# Patient Record
Sex: Male | Born: 1970 | Race: Black or African American | Hispanic: No | Marital: Single | Smoking: Never smoker
Health system: Southern US, Community
[De-identification: ages and names within clinical notes are randomized; demographics above are authoritative.]

## PROBLEM LIST (undated history)

## (undated) DIAGNOSIS — E119 Type 2 diabetes mellitus without complications: Secondary | ICD-10-CM

## (undated) DIAGNOSIS — I10 Essential (primary) hypertension: Secondary | ICD-10-CM

## (undated) DIAGNOSIS — J45909 Unspecified asthma, uncomplicated: Secondary | ICD-10-CM

## (undated) DIAGNOSIS — E669 Obesity, unspecified: Secondary | ICD-10-CM

## (undated) DIAGNOSIS — J9622 Acute and chronic respiratory failure with hypercapnia: Secondary | ICD-10-CM

## (undated) DIAGNOSIS — E876 Hypokalemia: Secondary | ICD-10-CM

---

## 2016-06-17 HISTORY — PX: TRACHEOSTOMY: SUR1362

## 2017-05-28 ENCOUNTER — Emergency Department
Admission: EM | Admit: 2017-05-28 | Discharge: 2017-05-28 | Disposition: A | Discharge status: Home / Self Care | Payer: Medicare Other | Attending: Emergency Medicine | Admitting: Emergency Medicine

## 2017-05-28 ENCOUNTER — Encounter: Payer: Self-pay | Admitting: Emergency Medicine

## 2017-05-28 ENCOUNTER — Other Ambulatory Visit: Payer: Self-pay

## 2017-05-28 DIAGNOSIS — J9500 Unspecified tracheostomy complication: Secondary | ICD-10-CM | POA: Diagnosis not present

## 2017-05-28 DIAGNOSIS — J45909 Unspecified asthma, uncomplicated: Secondary | ICD-10-CM | POA: Diagnosis not present

## 2017-05-28 DIAGNOSIS — I10 Essential (primary) hypertension: Secondary | ICD-10-CM | POA: Diagnosis not present

## 2017-05-28 HISTORY — DX: Acute and chronic respiratory failure with hypercapnia: J96.22

## 2017-05-28 HISTORY — DX: Hypokalemia: E87.6

## 2017-05-28 HISTORY — DX: Essential (primary) hypertension: I10

## 2017-05-28 HISTORY — DX: Obesity, unspecified: E66.9

## 2017-05-28 HISTORY — DX: Unspecified asthma, uncomplicated: J45.909

## 2017-05-28 NOTE — Progress Notes (Signed)
Placed pt on 35% ATC for pt desats. RN and MD aware.

## 2017-05-28 NOTE — ED Notes (Signed)
Report given to [facility]. Caregiver verbalized understanding and denied questions.

## 2017-05-28 NOTE — ED Provider Notes (Signed)
[facility] Emergency Department Provider Note    First MD Initiated Contact with Patient 05/28/17 [phone]     (approximate)  I have reviewed the triage vital signs and the nursing notes.   HISTORY  Chief Complaint Tracheostomy Tube Change    HPI Edgar Taylor is a 46 y.o. male below list of chronic medical conditions presents to the emergency department via EMS [facility] with history of tracheostomy tube dislodgment. Patient unsure when [name] was dislodged.  Patient states that trach was placed at [facility] in October.   Past Medical History:  Diagnosis Date  . Acute and chronic respiratory failure with hypercapnia (HCC)   . Asthma   . Hypertension   . Hypokalemia   . Obesity     There are no active problems to display for this patient.     Prior to Admission medications   Not on File    Allergies No known drug allergies No family history on file.  Social History Social History   Tobacco Use  . Smoking status: Not on file  Substance Use Topics  . Alcohol use: Not on file  . Drug use: Not on file    Review of Systems Constitutional: No fever/chills Eyes: No visual changes. ENT: No sore throat.  Positive for dislodged tracheostomy tube  Cardiovascular: Denies chest pain. Respiratory: Denies shortness of breath. Gastrointestinal: No abdominal pain.  No nausea, no vomiting.  No diarrhea.  No constipation. Genitourinary: Negative for dysuria. Musculoskeletal: Negative for neck pain.  Negative for back pain. Integumentary: Negative for rash. Neurological: Negative for headaches, focal weakness or numbness.   ____________________________________________   PHYSICAL EXAM:  VITAL SIGNS: ED Triage Vitals  Enc Vitals Group     BP 05/28/17 0523 136/64     Pulse Rate 05/28/17 0523 (!) 105     Resp --      Temp 05/28/17 0523 98.1 F (36.7 C)     Temp Source 05/28/17 0523 Oral     SpO2 05/28/17 0523 94 %   Weight 05/28/17 0524 (!) 225.9 kg (498 lb)     Height 05/28/17 0524 1.778 m (5\' 10" )     Head Circumference --      Peak Flow --      Pain Score 05/28/17 0523 0     Pain Loc --      Pain Edu? --      Excl. in GC? --     Constitutional: Alert and oriented. Well appearing and in no acute distress. Eyes: Conjunctivae are normal. Head: Atraumatic. Mouth/Throat: Mucous membranes are moist.  Tracheostomy site without any signs of or drainage. Neck: No stridor.   Cardiovascular: Normal rate, regular rhythm. Good peripheral circulation. Grossly normal heart sounds. Respiratory: Normal respiratory effort.  No retractions. Lungs CTAB. Gastrointestinal: Soft and nontender. No distention.   Musculoskeletal: No lower extremity tenderness nor edema. No gross deformities of extremities. Neurologic:  Normal speech and language. No gross focal neurologic deficits are appreciated.  Skin:  Skin is warm, dry and intact. No rash noted. Psychiatric: Mood and affect are normal. Speech and behavior are normal.    Procedures   ____________________________________________   INITIAL IMPRESSION / ASSESSMENT AND PLAN / ED COURSE  As part of my medical decision making, I reviewed the following data within the electronic MEDICAL RECORD [id number] year old male presenting with dislodged tracheostomy tube.  Per [facility] patient's tracheostomy tube was not 8 cuffed.  Unable to place an 8 cuffed secondary  to small diameter of the tracheostomy opening and as such a 4 was placed.  Patient's oxygen saturation 98% on trach collar.  Patient denies any discomfort or dyspnea.  Patient be referred to ENT Dr. Willeen CassBennett for further outpatient evaluation. ____________________________________________  FINAL CLINICAL IMPRESSION(S) / ED DIAGNOSES  Final diagnoses:  Complication of tracheostomy tube (HCC)     MEDICATIONS GIVEN DURING THIS VISIT:  Medications - No data to display   ED Discharge Orders    None        Note:  This document was prepared using Dragon voice recognition software and may include unintentional dictation errors.    Darci CurrentBrown, Zavalla N, MD 05/29/17 [phone]

## 2017-05-28 NOTE — Progress Notes (Signed)
Called to replace missing trach by RN. Pt stated and facility confirmed pt used #8 Shiley trach. Attempted to place #8 cuffless trach without success. Using a bougie catheter I was able to replace trach with a #4 cuffless Shiley trach. Small amount of bloody secretions around trach site. Edgar Taylor was secured with trach collar tie, placement was confirmed by EtCo2 with positive color change.

## 2017-05-28 NOTE — ED Notes (Signed)
SpO2 of 88% documented at 0800 incorrect.

## 2017-05-28 NOTE — ED Triage Notes (Addendum)
Patient coming from [facility] for missing trach from midnight to 3am. Patient had vomited earlier and was sent here when they realized his trach was missing.

## 2017-05-28 NOTE — ED Notes (Signed)
Kristie from RT placed a cuffless # 4 when patient was missing trach from [facility]. Attempted to place #8 cuffless but did not fit.

## 2017-05-29 ENCOUNTER — Emergency Department: Payer: Medicare Other

## 2017-05-29 ENCOUNTER — Observation Stay
Admission: EM | Admit: 2017-05-29 | Discharge: 2017-05-30 | Disposition: A | Discharge status: Skilled Nursing Facility | Payer: Medicare Other | Attending: Internal Medicine | Admitting: Internal Medicine

## 2017-05-29 ENCOUNTER — Encounter: Payer: Self-pay | Admitting: Emergency Medicine

## 2017-05-29 DIAGNOSIS — R0902 Hypoxemia: Secondary | ICD-10-CM | POA: Diagnosis present

## 2017-05-29 DIAGNOSIS — Z79899 Other long term (current) drug therapy: Secondary | ICD-10-CM | POA: Diagnosis not present

## 2017-05-29 DIAGNOSIS — I10 Essential (primary) hypertension: Secondary | ICD-10-CM | POA: Insufficient documentation

## 2017-05-29 DIAGNOSIS — J9621 Acute and chronic respiratory failure with hypoxia: Secondary | ICD-10-CM | POA: Diagnosis not present

## 2017-05-29 DIAGNOSIS — E876 Hypokalemia: Secondary | ICD-10-CM | POA: Insufficient documentation

## 2017-05-29 DIAGNOSIS — Y833 Surgical operation with formation of external stoma as the cause of abnormal reaction of the patient, or of later complication, without mention of misadventure at the time of the procedure: Secondary | ICD-10-CM | POA: Diagnosis not present

## 2017-05-29 DIAGNOSIS — J9503 Malfunction of tracheostomy stoma: Principal | ICD-10-CM | POA: Insufficient documentation

## 2017-05-29 DIAGNOSIS — D72829 Elevated white blood cell count, unspecified: Secondary | ICD-10-CM | POA: Insufficient documentation

## 2017-05-29 DIAGNOSIS — G4733 Obstructive sleep apnea (adult) (pediatric): Secondary | ICD-10-CM | POA: Insufficient documentation

## 2017-05-29 DIAGNOSIS — J9500 Unspecified tracheostomy complication: Secondary | ICD-10-CM | POA: Diagnosis present

## 2017-05-29 DIAGNOSIS — Z6841 Body Mass Index (BMI) 40.0 and over, adult: Secondary | ICD-10-CM | POA: Diagnosis not present

## 2017-05-29 DIAGNOSIS — J45909 Unspecified asthma, uncomplicated: Secondary | ICD-10-CM | POA: Diagnosis not present

## 2017-05-29 LAB — BASIC METABOLIC PANEL
Anion gap: 9 (ref 5–15)
BUN: 13 mg/dL (ref 6–20)
CALCIUM: 9.2 mg/dL (ref 8.9–10.3)
CO2: 37 mmol/L — AB (ref 22–32)
CREATININE: 1.13 mg/dL (ref 0.61–1.24)
Chloride: 90 mmol/L — ABNORMAL LOW (ref 101–111)
Glucose, Bld: 106 mg/dL — ABNORMAL HIGH (ref 65–99)
Potassium: 3.4 mmol/L — ABNORMAL LOW (ref 3.5–5.1)
Sodium: 136 mmol/L (ref 135–145)

## 2017-05-29 LAB — CBC WITH DIFFERENTIAL/PLATELET
BASOS PCT: 1 %
Basophils Absolute: 0.1 10*3/uL (ref 0–0.1)
EOS ABS: 0.2 10*3/uL (ref 0–0.7)
EOS PCT: 2 %
HEMATOCRIT: 33.6 % — AB (ref 40.0–52.0)
Hemoglobin: 11.1 g/dL — ABNORMAL LOW (ref 13.0–18.0)
Lymphocytes Relative: 13 %
Lymphs Abs: 1.7 10*3/uL (ref 1.0–3.6)
MCH: 27.9 pg (ref 26.0–34.0)
MCHC: 33 g/dL (ref 32.0–36.0)
MCV: 84.6 fL (ref 80.0–100.0)
MONO ABS: 1.4 10*3/uL — AB (ref 0.2–1.0)
MONOS PCT: 11 %
Neutro Abs: 9.5 10*3/uL — ABNORMAL HIGH (ref 1.4–6.5)
Neutrophils Relative %: 73 %
PLATELETS: 344 10*3/uL (ref 150–440)
RBC: 3.97 MIL/uL — ABNORMAL LOW (ref 4.40–5.90)
RDW: 14.2 % (ref 11.5–14.5)
WBC: 13 10*3/uL — ABNORMAL HIGH (ref 3.8–10.6)

## 2017-05-29 LAB — URINALYSIS, COMPLETE (UACMP) WITH MICROSCOPIC
BACTERIA UA: NONE SEEN
BILIRUBIN URINE: NEGATIVE
Glucose, UA: NEGATIVE mg/dL
Hgb urine dipstick: NEGATIVE
Ketones, ur: NEGATIVE mg/dL
Leukocytes, UA: NEGATIVE
NITRITE: NEGATIVE
PH: 6 (ref 5.0–8.0)
Protein, ur: NEGATIVE mg/dL
SPECIFIC GRAVITY, URINE: 1.006 (ref 1.005–1.030)
SQUAMOUS EPITHELIAL / LPF: NONE SEEN

## 2017-05-29 MED ORDER — POLYETHYLENE GLYCOL 3350 17 G PO PACK
17.0000 g | PACK | Freq: Every day | ORAL | Status: DC
  Administered 2017-05-30 (×2): 17 g via ORAL
  Filled 2017-05-29 (×2): qty 1

## 2017-05-29 MED ORDER — METFORMIN HCL 500 MG PO TABS
500.0000 mg | ORAL_TABLET | Freq: Two times a day (BID) | ORAL | Status: DC
  Administered 2017-05-30: 500 mg via ORAL
  Filled 2017-05-29 (×3): qty 1

## 2017-05-29 MED ORDER — FAMOTIDINE 20 MG PO TABS
20.0000 mg | ORAL_TABLET | Freq: Every day | ORAL | Status: DC
  Administered 2017-05-30: 20 mg via ORAL
  Filled 2017-05-29: qty 1

## 2017-05-29 MED ORDER — TORSEMIDE 20 MG PO TABS
40.0000 mg | ORAL_TABLET | Freq: Every day | ORAL | Status: DC
  Administered 2017-05-30 (×2): 40 mg via ORAL
  Filled 2017-05-29 (×2): qty 2

## 2017-05-29 MED ORDER — AMLODIPINE BESYLATE 5 MG PO TABS
5.0000 mg | ORAL_TABLET | Freq: Every day | ORAL | Status: DC
  Administered 2017-05-30: 08:00:00 5 mg via ORAL
  Filled 2017-05-29: qty 1

## 2017-05-29 MED ORDER — ACETAMINOPHEN 325 MG PO TABS: 650.0000 mg | ORAL_TABLET | Freq: Four times a day (QID) | ORAL | Status: DC | PRN

## 2017-05-29 MED ORDER — ONDANSETRON HCL 4 MG PO TABS: 4.0000 mg | ORAL_TABLET | Freq: Four times a day (QID) | ORAL | Status: DC | PRN

## 2017-05-29 MED ORDER — ACETAMINOPHEN 650 MG RE SUPP: 650.0000 mg | Freq: Four times a day (QID) | RECTAL | Status: DC | PRN

## 2017-05-29 MED ORDER — SODIUM CHLORIDE 0.9% FLUSH: 3.0000 mL | INTRAVENOUS | Status: DC | PRN

## 2017-05-29 MED ORDER — HYDROCODONE-ACETAMINOPHEN 5-325 MG PO TABS: 1.0000 | ORAL_TABLET | ORAL | Status: DC | PRN

## 2017-05-29 MED ORDER — ALBUTEROL SULFATE (2.5 MG/3ML) 0.083% IN NEBU: 2.5000 mg | INHALATION_SOLUTION | RESPIRATORY_TRACT | Status: DC | PRN

## 2017-05-29 MED ORDER — ENOXAPARIN SODIUM 40 MG/0.4ML ~~LOC~~ SOLN
40.0000 mg | Freq: Two times a day (BID) | SUBCUTANEOUS | Status: DC
  Administered 2017-05-30: 40 mg via SUBCUTANEOUS
  Filled 2017-05-29: qty 0.4

## 2017-05-29 MED ORDER — ONDANSETRON HCL 4 MG/2ML IJ SOLN: 4.0000 mg | Freq: Four times a day (QID) | INTRAMUSCULAR | Status: DC | PRN

## 2017-05-29 MED ORDER — SODIUM CHLORIDE 0.9% FLUSH
3.0000 mL | Freq: Two times a day (BID) | INTRAVENOUS | Status: DC
  Administered 2017-05-30: 3 mL via INTRAVENOUS

## 2017-05-29 MED ORDER — POTASSIUM CHLORIDE 20 MEQ/15ML (10%) PO SOLN
40.0000 meq | Freq: Once | ORAL | Status: AC
  Administered 2017-05-30: 03:00:00 40 meq via ORAL
  Filled 2017-05-29: qty 30

## 2017-05-29 MED ORDER — SODIUM CHLORIDE 0.9 % IV SOLN: 250.0000 mL | INTRAVENOUS | Status: DC | PRN

## 2017-05-29 NOTE — ED Notes (Signed)
Admitting MD at bedside at this time.

## 2017-05-29 NOTE — Progress Notes (Signed)
Anticoagulation monitoring(Lovenox):  46 yo male ordered Lovenox 40 mg Q24h  Filed Weights   05/29/17 1304  Weight: (!) 498 lb (225.9 kg)   BMI 71.46   Lab Results  Component Value Date   CREATININE 1.13 05/29/2017   Estimated Creatinine Clearance: 158.3 mL/min (by C-G formula based on SCr of 1.13 mg/dL). Hemoglobin & Hematocrit     Component Value Date/Time   HGB 11.1 (L) 05/29/2017 1450   HCT 33.6 (L) 05/29/2017 1450     Per Protocol for Patient with estCrcl > 30 ml/min and BMI > 40, will transition to Lovenox 40 mg Q12h.

## 2017-05-29 NOTE — ED Provider Notes (Signed)
[facility] Emergency Department Provider Note  ____________________________________________   First MD Initiated Contact with Patient 05/29/17 1315     (approximate)  I have reviewed the triage vital signs and the nursing notes.   HISTORY  Chief Complaint Tracheostomy Tube Change   HPI Edgar Taylor is a 46 y.o. male with a history of obesity as well as acute on chronic respiratory failure with hypercapnia with a tracheostomy who is presenting with tracheostomy malfunction.  The patient was just seen here yesterday for displaced tracheostomy.  He is now returning for the tracheostomy being displaced again.  He says that he has had this in for 3-4 months.  Yesterday when in the emergency department the patient had a size 4 tracheostomy placed, downgraded from a size 8 that was in place previously.  Patient does not report any respiratory distress at this time.  Is questioning whether he can have the tracheostomy removed.   Past Medical History:  Diagnosis Date  . Acute and chronic respiratory failure with hypercapnia (HCC)   . Asthma   . Hypertension   . Hypokalemia   . Obesity     There are no active problems to display for this patient.   History reviewed. No pertinent surgical history.  Prior to Admission medications   Medication Sig Start Date End Date Taking? Authorizing Provider  acetaminophen (TYLENOL) 325 MG tablet Take 650 mg by mouth every 4 (four) hours as needed for mild pain or fever.   Yes [provider]  amLODipine (NORVASC) 5 MG tablet Take 5 mg by mouth daily.   Yes [provider]  enoxaparin (LOVENOX) 40 MG/0.4ML injection Inject 40 mg into the skin daily. 05/20/17  Yes [provider]  famotidine (PEPCID) 20 MG tablet Take 20 mg by mouth at bedtime.   Yes [provider]  levalbuterol (XOPENEX) 1.25 MG/3ML nebulizer solution Take 1.25 mg by nebulization every 6 (six) hours as needed for  wheezing.   Yes [provider]  metFORMIN (GLUCOPHAGE) 500 MG tablet Take 500 mg by mouth 2 (two) times daily with a meal.   Yes [provider]  Multiple Vitamins-Minerals (MULTIVITAMIN WITH MINERALS) tablet Take 1 tablet by mouth daily.   Yes [provider]  ondansetron (ZOFRAN) 4 MG/2ML SOLN injection Inject 4 mg into the vein every 6 (six) hours as needed for nausea or vomiting.   Yes [provider]  torsemide (DEMADEX) 20 MG tablet Take 40 mg by mouth daily.   Yes [provider]  polyethylene glycol (MIRALAX / GLYCOLAX) packet Take 17 g by mouth daily.    [provider]    Allergies Patient has no known allergies.  No family history on file.  Social History Social History   Tobacco Use  . Smoking status: Never Smoker  . Smokeless tobacco: Never Used  Substance Use Topics  . Alcohol use: No    Frequency: Never  . Drug use: No    Review of Systems  Constitutional: No fever/chills Eyes: No visual changes. ENT: No sore throat. Cardiovascular: Denies chest pain. Respiratory: Denies shortness of breath. Gastrointestinal: No abdominal pain.  No nausea, no vomiting.  No diarrhea.  No constipation. Genitourinary: Negative for dysuria. Musculoskeletal: Negative for back pain. Skin: Negative for rash. Neurological: Negative for headaches, focal weakness or numbness.   ____________________________________________   PHYSICAL EXAM:  VITAL SIGNS: ED Triage Vitals [05/29/17 1304]  Enc Vitals Group     BP (!) 151/67  Pulse Rate (!) 110     Resp (!) 22     Temp 98.3 F (36.8 C)     Temp Source Oral     SpO2 94 %     Weight (!) 498 lb (225.9 kg)     Height      Head Circumference      Peak Flow      Pain Score 0     Pain Loc      Pain Edu?      Excl. in GC?     Constitutional: Alert and oriented.  in no acute distress. Eyes: Conjunctivae are normal.  Head: Atraumatic. Nose: No  congestion/rhinnorhea. Mouth/Throat: Mucous membranes are moist.  Neck: No stridor.  Stoma visualized and without any surrounding erythema, induration or pus. Cardiovascular: Mild tachycardia to the low 100s, regular rhythm. Grossly normal heart sounds.  Respiratory: Normal respiratory effort.  No retractions. Lungs CTAB. Gastrointestinal: Soft and nontender. No distention. Musculoskeletal: No lower extremity tenderness.  No joint effusions.  Minimal lower extremity edema bilaterally. Neurologic:  Normal speech and language. No gross focal neurologic deficits are appreciated. Skin:  Skin is warm, dry and intact. No rash noted. Psychiatric: Mood and affect are normal. Speech and behavior are normal.  ____________________________________________   LABS (all labs ordered are listed, but only abnormal results are displayed)  Labs Reviewed  CBC WITH DIFFERENTIAL/PLATELET - Abnormal; Notable for the following components:      Result Value   WBC 13.0 (*)    RBC 3.97 (*)    Hemoglobin 11.1 (*)    HCT 33.6 (*)    Neutro Abs 9.5 (*)    Monocytes Absolute 1.4 (*)    All other components within normal limits  BASIC METABOLIC PANEL - Abnormal; Notable for the following components:   Potassium 3.4 (*)    Chloride 90 (*)    CO2 37 (*)    Glucose, Bld 106 (*)    All other components within normal limits  URINALYSIS, COMPLETE (UACMP) WITH MICROSCOPIC   ____________________________________________  EKG   ____________________________________________  RADIOLOGY   ____________________________________________   PROCEDURES  Procedure(s) performed:  I attempted to replace the size 4 tracheostomy with lubrication.  However, I was unable to pass it.  There appeared to be obstruction.  Respiratory was called and they were able to pass a placeholder tube and then we attempted to put in a suction catheter and put a size for tracheostomy over that but despite being able to pass the suction tube  were unable to pass the tracheostomy.  Procedures  Critical Care performed:   ____________________________________________   INITIAL IMPRESSION / ASSESSMENT AND PLAN / ED COURSE  Pertinent labs & imaging results that were available during my care of the patient were reviewed by me and considered in my medical decision making (see chart for details).  DDX: Stoma closure, tracheostomy malfunction As part of my medical decision making, I reviewed the following data within the electronic MEDICAL RECORD NUMBER Notes from prior ED visits  ----------------------------------------- 3:18 PM on 05/29/2017 -----------------------------------------  I discussed the case with Dr. Andee PolesVaught of ear nose and throat who says that since the patient is able to function with what appears to be a closed tracheostomy to put a gauze pad over the tracheostomy and a piece of tape to see how the patient does.  It may be that the patient will be able to not need his tracheostomy.  However, Dr. Andee PolesVaught says that he will be in  the emergency department to evaluate within several hours.  Patient doing well at this time on nasal cannula oxygen.    ----------------------------------------- 5:38 PM on 05/29/2017 -----------------------------------------  Evaluated at the bedside by Dr. Andee PolesVaught who discussed the case with the patient and will be leaving out the tracheostomy.  Without the tracheostomy the patient desaturated to 87% on room air.  He was placed on supplemental nasal cannula oxygen.  I checked with his long-term care facility, [facility], says they are unable to monitor his oxygen level constantly at [facility].  Patient does not have established nasal cannula oxygen at home.  Patient will be admitted to the hospital.  I feel that he likely also has undiagnosed sleep apnea and would do poorly if sent back to [facility] unmonitored.  Admitted to Dr. Imogene Burnhen.  Patient aware of the plan and  diagnosis and willing to comply.  Chest x-ray placed because of hypoxia.  Urinalysis placed at the request of Dr. Imogene Burnhen because of elevated white blood cell count.     ____________________________________________   FINAL CLINICAL IMPRESSION(S) / ED DIAGNOSES  Tracheostomy malfunction. Hypoxia   NEW MEDICATIONS STARTED DURING THIS VISIT:  This SmartLink is deprecated. Use AVSMEDLIST instead to display the medication list for a patient.   Note:  This document was prepared using Dragon voice recognition software and may include unintentional dictation errors.     Myrna BlazerSchaevitz, David Matthew, MD 05/29/17 1739

## 2017-05-29 NOTE — ED Notes (Signed)
MD at bedside, RT called to help reinsert trach

## 2017-05-29 NOTE — ED Triage Notes (Addendum)
Pt to ED via EMS from [facility] , pt has pulled out trach, states " it keeps falling out" . PT was seen last night and MD Manson PasseyBrown reinserted tube. Pt denies any SOB . Per staff at facility, pt has been stable without trach and they have been trying to contact ENT for d/c of trach. VS stable

## 2017-05-29 NOTE — ED Notes (Signed)
RT at bedside.

## 2017-05-29 NOTE — H&P (Signed)
[facility] [place] [facility]   PATIENT NAME: Edgar PanningKenneth Mcmullen    MR#:  [id number]  DATE OF BIRTH:  01/11/1971  DATE OF ADMISSION:  05/29/2017  PRIMARY CARE PHYSICIAN: System, Pcp Not In   REQUESTING/REFERRING PHYSICIAN: Myrna BlazerSchaevitz, David Matthew, MD  CHIEF COMPLAINT:   Chief Complaint  Patient presents with  . Tracheostomy Tube Change   Tracheostomy dislodge HISTORY OF PRESENT ILLNESS:  Edgar Taylor  is a 46 y.o. male with a known history of acute on chronic respiratory failure, asthma, hypertension, obesity and OSA.  The patient was sent from nursing home to ED due to above chief complaints.  The patient was here yesterday for displaced a tracheostomy.  He was put on a size 4 tracheostomy and sent back to nursing facility.  He is now returning from tracheostomy being displaced again.  He was found hypoxia with O2 saturation 87% in room air. ENT physician Dr. Andee PolesVaught suggested leaving out the tracheostomy tube and monitor O2 saturation.  Since the long-term care facility can now to monitor his oxygen level constantly, ED physician request admission.  PAST MEDICAL HISTORY:   Past Medical History:  Diagnosis Date  . Acute and chronic respiratory failure with hypercapnia (HCC)   . Asthma   . Hypertension   . Hypokalemia   . Obesity     PAST SURGICAL HISTORY:  History reviewed. No pertinent surgical history.  SOCIAL HISTORY:   Social History   Tobacco Use  . Smoking status: Never Smoker  . Smokeless tobacco: Never Used  Substance Use Topics  . Alcohol use: No    Frequency: Never    FAMILY HISTORY:  No family history on file.  DRUG ALLERGIES:  No Known Allergies  REVIEW OF SYSTEMS:   Review of Systems  Constitutional: Negative for chills, fever and malaise/fatigue.  HENT: Negative for sore throat.   Eyes: Negative for blurred vision and double vision.  Respiratory: Positive for shortness of breath. Negative for cough, hemoptysis,  wheezing and stridor.   Cardiovascular: Negative for chest pain, palpitations, orthopnea and leg swelling.  Gastrointestinal: Negative for abdominal pain, blood in stool, diarrhea, melena, nausea and vomiting.  Genitourinary: Negative for dysuria, flank pain and hematuria.  Musculoskeletal: Negative for back pain and joint pain.  Skin: Negative for rash.  Neurological: Negative for dizziness, sensory change, focal weakness, seizures, loss of consciousness, weakness and headaches.  Endo/Heme/Allergies: Negative for polydipsia.  Psychiatric/Behavioral: Negative for depression. The patient is not nervous/anxious.     MEDICATIONS AT HOME:   Prior to Admission medications   Medication Sig Start Date End Date Taking? Authorizing Provider  acetaminophen (TYLENOL) 325 MG tablet Take 650 mg by mouth every 4 (four) hours as needed for mild pain or fever.   Yes [provider]  amLODipine (NORVASC) 5 MG tablet Take 5 mg by mouth daily.   Yes [provider]  enoxaparin (LOVENOX) 40 MG/0.4ML injection Inject 40 mg into the skin daily. 05/20/17  Yes [provider]  famotidine (PEPCID) 20 MG tablet Take 20 mg by mouth at bedtime.   Yes [provider]  levalbuterol (XOPENEX) 1.25 MG/3ML nebulizer solution Take 1.25 mg by nebulization every 6 (six) hours as needed for wheezing.   Yes [provider]  metFORMIN (GLUCOPHAGE) 500 MG tablet Take 500 mg by mouth 2 (two) times daily with a meal.   Yes [provider]  Multiple Vitamins-Minerals (MULTIVITAMIN WITH MINERALS) tablet Take 1 tablet by mouth daily.   Yes  [provider]  ondansetron (ZOFRAN) 4 MG/2ML SOLN injection Inject 4 mg into the vein every 6 (six) hours as needed for nausea or vomiting.   Yes [provider]  torsemide (DEMADEX) 20 MG tablet Take 40 mg by mouth daily.   Yes [provider]  polyethylene glycol (MIRALAX / GLYCOLAX) packet Take 17 g by mouth daily.     [provider]      VITAL SIGNS:  Blood pressure 133/75, pulse 99, temperature 98.3 F (36.8 C), temperature source Oral, resp. rate (!) 27, weight (!) 498 lb (225.9 kg), SpO2 (!) 87 %.  PHYSICAL EXAMINATION:  Physical Exam  GENERAL:  46 y.o.-year-old patient lying in the bed with no acute distress.  Morbid obesity. EYES: Pupils equal, round, reactive to light and accommodation. No scleral icterus. Extraocular muscles intact.  HEENT: Head atraumatic, normocephalic. Oropharynx and nasopharynx clear.  Tracheostomy hole in dressing. NECK:  Supple, no jugular venous distention. No thyroid enlargement, no tenderness.  LUNGS: Normal breath sounds bilaterally, no wheezing, rales,rhonchi or crepitation. No use of accessory muscles of respiration.  CARDIOVASCULAR: S1, S2 normal. No murmurs, rubs, or gallops.  ABDOMEN: Soft, nontender, nondistended. Bowel sounds present. No organomegaly or mass.  EXTREMITIES: No pedal edema, cyanosis, or clubbing.  NEUROLOGIC: Cranial nerves II through XII are intact. Muscle strength 3/5 in all extremities. Sensation intact. Gait not checked.  PSYCHIATRIC: The patient is alert and oriented x 3.  SKIN: No obvious rash, lesion, or ulcer.   LABORATORY PANEL:   CBC Recent Labs  Lab 05/29/17 1450  WBC 13.0*  HGB 11.1*  HCT 33.6*  PLT 344   ------------------------------------------------------------------------------------------------------------------  Chemistries  Recent Labs  Lab 05/29/17 1450  NA 136  K 3.4*  CL 90*  CO2 37*  GLUCOSE 106*  BUN 13  CREATININE 1.13  CALCIUM 9.2   ------------------------------------------------------------------------------------------------------------------  Cardiac Enzymes No results for input(s): TROPONINI in the last 168 hours. ------------------------------------------------------------------------------------------------------------------  RADIOLOGY:  No results found.    IMPRESSION AND  PLAN:   Tracheostomy tube dislodge The patient will be placed for observation. Continue oxygen by nasal cannula, DuoNeb as needed, continue home nebulizer.  Acute on chronic respiratory failure with hypoxia due to above. Continue oxygen by nasal cannula.  NEB PRN.  Hypokalemia.  Give potassium supplement.  Leukocytosis.  Unclear etiology, follow-up urinalysis and CBC.  Hypertension.  Continue home hypertension medication.  OSA and morbid obesity.  All the records are reviewed and case discussed with ED provider. Management plans discussed with the patient, family and they are in agreement.  CODE STATUS: Full code  TOTAL TIME TAKING CARE OF THIS PATIENT: 45 minutes.    Shaune PollackQing Ralphie Lovelady M.D on 05/29/2017 at 6:29 PM  Between 7am to 6pm - Pager - [phone]  After 6pm go to [email or web address] - Social research officer, [email or web address] [email or web address]  [facility] [place] [facility]  Office  [phone]  CC: Primary care physician; System, Pcp Not In   Note: This dictation was prepared with Dragon dictation along with smaller phrase technology. Any transcriptional errors that result from this process are unin

## 2017-05-29 NOTE — Consult Note (Signed)
Edgar Taylor.. Edgar Taylor [id number] 05/15/1971 Edgar Taylor, Edgar Taylor*  Reason for Consult: Dislodged tracheostomy tube  HPI: 46 y.o. Male with history of tracheostomy tube placement that became dislodged again today.  Per records was seen yesterday at ED and unable to replace with [facility] and Size 4 placed.  Today multiple attempts at Size 4 were done and patient began spitting up blood.  Asked to evaluate for tracheostomy tube replacement.  Patient reports not sure why he had it placed.  He reports he does not want it any more.  Denies any history of breathing difficulty.  Reports sleeps well prior to tracheostomy tube placement.  Allergies: No Known Allergies  ROS: Review of systems normal other than 12 systems except per HPI.  PMH:  Past Medical History:  Diagnosis Date  . Acute and chronic respiratory failure with hypercapnia (HCC)   . Asthma   . Hypertension   . Hypokalemia   . Obesity     FH: No family history on file.  SH:  Social History   Socioeconomic History  . Marital status: Single    Spouse name: Not on file  . Number of children: Not on file  . Years of education: Not on file  . Highest education level: Not on file  Social Needs  . Financial resource strain: Not on file  . Food insecurity - worry: Not on file  . Food insecurity - inability: Not on file  . Transportation needs - medical: Not on file  . Transportation needs - non-medical: Not on file  Occupational History  . Not on file  Tobacco Use  . Smoking status: Never Smoker  . Smokeless tobacco: Never Used  Substance and Sexual Activity  . Alcohol use: No    Frequency: Never  . Drug use: No  . Sexual activity: Not on file  Other Topics Concern  . Not on file  Social History Narrative  . Not on file    PSH: History reviewed. No pertinent surgical history.  Physical  Exam:  GEN- Morbidly obsese male in NAD NEURO- CN 2-12 grossly intact and symmetric. EARS- EAC/TMs normal BL.  OC/OP- Oral  cavity, lips, gums, ororpharynx normal with no masses or lesions.  SKIN- Skin warm and dry.  NOSE- Nasal cavity without polyps or purulence. External nose and ears without masses or lesions. EOMI, PERRLA.  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  No active bleeding.  Neck supple with no masses or lesions. No lymphadenopathy palpated.   A/P: Morbid Obesity s/p tracheostomy tube placement for apparent acute on chronic respiratory failure in 10/18 in [place]  Plan:  Had a frank discussion with patient regarding options.  One is to put the patient to sleep and revise tracheostomy tube again with size 8 as I am not willing to cause potentially more airway swelling in this patient here in ED as he already had begun to spit up blood from attempts.  PAtient also does not want tracheostomy tube replaced.  Other option is to monitor oxygen levels and make sure he does not have any distress.  Tracheostoma has already been covered for over 3 to 4 hours without any issues on 2L [place].  Patient was on oxygen even with tracheostomy tube.  Discussed issue with patient is that he most likely has OSA and that tracheostomy tube could have been helping him in more ways than he knows.  After discussion, he wishes to wait at this time and not attempt to  replace tracheostomy tube any more nor go to OR for revision.  Discussed with ER and will decide if facility comfortable with monitoring or if needs monitoring here.  If patient does desaturate due to OSA, at this point would need to be treated with oral device given no significant air movement through tracheostoma.   Caidance Sybert 05/29/2017 5:42 PM

## 2017-05-29 NOTE — ED Notes (Signed)
Pt's L upper arm PICC line dressing is non-occlusive, peeled back from PICC insertion site. Plan to change drsg.

## 2017-05-30 ENCOUNTER — Other Ambulatory Visit: Payer: Self-pay

## 2017-05-30 LAB — MRSA PCR SCREENING: MRSA BY PCR: POSITIVE — AB

## 2017-05-30 LAB — CBC
HCT: 33.1 % — ABNORMAL LOW (ref 40.0–52.0)
Hemoglobin: 11.1 g/dL — ABNORMAL LOW (ref 13.0–18.0)
MCH: 28.5 pg (ref 26.0–34.0)
MCHC: 33.6 g/dL (ref 32.0–36.0)
MCV: 84.8 fL (ref 80.0–100.0)
PLATELETS: 291 10*3/uL (ref 150–440)
RBC: 3.9 MIL/uL — ABNORMAL LOW (ref 4.40–5.90)
RDW: 14 % (ref 11.5–14.5)
WBC: 13.8 10*3/uL — AB (ref 3.8–10.6)

## 2017-05-30 LAB — BASIC METABOLIC PANEL
Anion gap: 5 (ref 5–15)
BUN: 12 mg/dL (ref 6–20)
CO2: 41 mmol/L — ABNORMAL HIGH (ref 22–32)
CREATININE: 1.02 mg/dL (ref 0.61–1.24)
Calcium: 9.1 mg/dL (ref 8.9–10.3)
Chloride: 92 mmol/L — ABNORMAL LOW (ref 101–111)
GFR calc Af Amer: >60 mL/min (ref >60)
Glucose, Bld: 96 mg/dL (ref 65–99)
Potassium: 3.9 mmol/L (ref 3.5–5.1)
SODIUM: 138 mmol/L (ref 135–145)

## 2017-05-30 MED ORDER — MUPIROCIN 2 % EX OINT: TOPICAL_OINTMENT | CUTANEOUS | 0 refills | Status: AC

## 2017-05-30 MED ORDER — MUPIROCIN 2 % EX OINT
1.0000 "application " | TOPICAL_OINTMENT | Freq: Two times a day (BID) | CUTANEOUS | Status: DC
  Filled 2017-05-30: qty 22

## 2017-05-30 MED ORDER — CHLORHEXIDINE GLUCONATE CLOTH 2 % EX PADS: 6.0000 | MEDICATED_PAD | Freq: Every day | CUTANEOUS | Status: DC

## 2017-05-30 MED ORDER — POTASSIUM CHLORIDE ER 20 MEQ PO TBCR: 20.0000 meq | EXTENDED_RELEASE_TABLET | Freq: Every day | ORAL | 0 refills | Status: AC

## 2017-05-30 NOTE — NC FL2 (Signed)
[place] MEDICAID FL2 LEVEL OF CARE SCREENING TOOL     IDENTIFICATION  Patient Name: Edgar PanningKenneth Taylor Birthdate: 10/04/1970 Sex: male Admission Date (Current Location): 05/29/2017  [place]   Facility and Address:  [facility], [address], [place], [place] [address]      Provider Number: [id number]  Attending Physician Name and Address:  Alford HighlandWieting, Richard, MD  Relative Name and Phone Number:       Current Level of Care: Hospital Recommended Level of Care: Skilled Nursing Facility Prior Approval Number:    Date Approved/Denied:   PASRR Number: ([id number] )  Discharge Plan: SNF    Current Diagnoses: Patient Active Problem List   Diagnosis Date Noted  . 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    Orientation RESPIRATION BLADDER Height & Weight     Self, Time, Place  O2(3 Liters Oxygen. ) Continent Weight: (!) 474 lb 9.6 oz (215.3 kg) Height:  5\' 10"  (177.8 cm)  BEHAVIORAL SYMPTOMS/MOOD NEUROLOGICAL BOWEL NUTRITION STATUS      Continent Diet(Diet: Heart Healhty )  AMBULATORY STATUS COMMUNICATION OF NEEDS Skin   Total Care Verbally Normal                       Personal Care Assistance Level of Assistance  Bathing, Feeding, Dressing Bathing Assistance: Maximum assistance Feeding assistance: Maximum assistance Dressing Assistance: Maximum assistance     Functional Limitations Info  Sight, Hearing, Speech Sight Info: Adequate Hearing Info: Adequate Speech Info: Adequate    SPECIAL CARE FACTORS FREQUENCY  PT (By licensed PT), OT (By licensed OT)     PT Frequency: (5) OT Frequency: (5)            Contractures      Additional Factors Info  Code Status, Allergies, Isolation Precautions Code Status Info: (Full Code. ) Allergies Info: (No Known Allergies. )     Isolation Precautions Info: (MRSA Nasal Swab. )     Current Medications (05/30/2017):  This is the current hospital active  medication list Current Facility-Administered Medications  Medication Dose Route Frequency Provider Last Rate Last Dose  . 0.9 %  sodium chloride infusion  250 mL Intravenous PRN Shaune Pollackhen, Qing, MD      . acetaminophen (TYLENOL) tablet 650 mg  650 mg Oral Q6H PRN Shaune Pollackhen, Qing, MD       Or  . acetaminophen (TYLENOL) suppository 650 mg  650 mg Rectal Q6H PRN Shaune Pollackhen, Qing, MD      . albuterol (PROVENTIL) (2.5 MG/3ML) 0.083% nebulizer solution 2.5 mg  2.5 mg Nebulization Q2H PRN Shaune Pollackhen, Qing, MD      . amLODipine (NORVASC) tablet 5 mg  5 mg Oral Daily Shaune Pollackhen, Qing, MD   5 mg at 05/30/17 0818  . Chlorhexidine Gluconate Cloth 2 % PADS 6 each  6 each Topical Q0600 Wieting, Richard, MD      . enoxaparin (LOVENOX) injection 40 mg  40 mg Subcutaneous Q12H Shaune Pollackhen, Qing, MD   40 mg at 05/30/17 0819  . famotidine (PEPCID) tablet 20 mg  20 mg Oral QHS Shaune Pollackhen, Qing, MD   20 mg at 05/30/17 0242  . HYDROcodone-acetaminophen (NORCO/VICODIN) 5-325 MG per tablet 1-2 tablet  1-2 tablet Oral Q4H PRN Shaune Pollackhen, Qing, MD      . metFORMIN (GLUCOPHAGE) tablet 500 mg  500 mg Oral BID WC Shaune Pollackhen, Qing, MD   500 mg at 05/30/17 0818  . mupirocin ointment (BACTROBAN) 2 % 1 application  1 application  Nasal BID Alford HighlandWieting, Richard, MD      . ondansetron [facility](ZOFRAN) tablet 4 mg  4 mg Oral Q6H PRN Shaune Pollackhen, Qing, MD       Or  . ondansetron [facility](ZOFRAN) injection 4 mg  4 mg Intravenous Q6H PRN Shaune Pollackhen, Qing, MD      . polyethylene glycol (MIRALAX / GLYCOLAX) packet 17 g  17 g Oral Daily Shaune Pollackhen, Qing, MD   17 g at 05/30/17 [phone]  . sodium chloride flush (NS) 0.9 % injection 3 mL  3 mL Intravenous Q12H Shaune Pollackhen, Qing, MD   3 mL at 05/30/17 0819  . sodium chloride flush (NS) 0.9 % injection 3 mL  3 mL Intravenous PRN Shaune Pollackhen, Qing, MD      . torsemide [facility](DEMADEX) tablet 40 mg  40 mg Oral Daily Shaune Pollackhen, Qing, MD   40 mg at 05/30/17 [phone]     Discharge Medications: Please see discharge summary for a list of discharge medications.  Relevant Imaging Results:  Relevant Lab  Results:   Additional Information (SSN: [id number])  Ryot Burrous, Darleen CrockerBailey M, LCSW

## 2017-05-30 NOTE — Progress Notes (Signed)
Patient discharged to [facility] via EMS. PICC pulled prior to discharge with no complications.

## 2017-05-30 NOTE — Discharge Summary (Signed)
[facility] - [place] at [facility]   PATIENT NAME: Edgar Taylor    MR#:  [id number]  DATE OF BIRTH:  May 23, 1971  DATE OF ADMISSION:  05/29/2017 ADMITTING PHYSICIAN: Shaune Pollack, MD  DATE OF DISCHARGE: 05/30/2017  PRIMARY CARE PHYSICIAN: System, Pcp Not In    ADMISSION DIAGNOSIS:  Hypoxia [R09.02] Tracheostomy malfunction (HCC) [J95.03]  DISCHARGE DIAGNOSIS:  Active Problems:   Tracheostomy complication (HCC)   SECONDARY DIAGNOSIS:   Past Medical History:  Diagnosis Date  . Acute and chronic respiratory failure with hypercapnia (HCC)   . Asthma   . Hypertension   . Hypokalemia   . Obesity     HOSPITAL COURSE:   1.  Tracheostomy tube dislodged.  Seen by ENT.  Patient does not want to have a trach.  Recommended nasal cannula oxygenation an outpatient sleep study.  Patient will be on 2 L nasal cannula. 2.  Acute on chronic respiratory failure with hypoxia.  Continue 2 L nasal cannula.  Likely underlying obesity hypoventilation syndrome and sleep apnea. 3.  Hypokalemia this was replaced.  Will give some replacement upon discharge. 4.  Morbid obesity weight loss needed 5.  Leukocytosis.  Unclear etiology.  Does not seem like he has an infection. 6.  Patient has PICC line in the left arm unclear reason.  Does not appear to be getting anything through that.  Discontinue PICC line. 7.  Essential hypertension continue usual medications  DISCHARGE CONDITIONS:   Fair  CONSULTS OBTAINED:  ENT  DRUG ALLERGIES:  No Known Allergies  DISCHARGE MEDICATIONS:   Allergies as of 05/30/2017   No Known Allergies     Medication List    TAKE these medications   acetaminophen 325 MG tablet Commonly known as:  TYLENOL Take 650 mg by mouth every 4 (four) hours as needed for mild pain or fever.   amLODipine 5 MG tablet Commonly known as:  NORVASC Take 5 mg by mouth daily.   enoxaparin 40 MG/0.4ML injection Commonly known as:  LOVENOX Inject 40 mg into the  skin daily.   famotidine 20 MG tablet Commonly known as:  PEPCID Take 20 mg by mouth at bedtime.   levalbuterol 1.25 MG/3ML nebulizer solution Commonly known as:  XOPENEX Take 1.25 mg by nebulization every 6 (six) hours as needed for wheezing.   metFORMIN 500 MG tablet Commonly known as:  GLUCOPHAGE Take 500 mg by mouth 2 (two) times daily with a meal.   multivitamin with minerals tablet Take 1 tablet by mouth daily.   mupirocin ointment 2 % Commonly known as:  BACTROBAN Apply to nose twice a day for 5 days   ondansetron 4 MG/2ML Soln injection Commonly known as:  ZOFRAN Inject 4 mg into the vein every 6 (six) hours as needed for nausea or vomiting.   polyethylene glycol packet Commonly known as:  MIRALAX / GLYCOLAX Take 17 g by mouth daily.   Potassium Chloride ER 20 MEQ Tbcr Take 20 mEq by mouth daily.   torsemide 20 MG tablet Commonly known as:  DEMADEX Take 40 mg by mouth daily.        DISCHARGE INSTRUCTIONS:   Follow-up Dr. rehab 1 day  If you experience worsening of your admission symptoms, develop shortness of breath, life threatening emergency, suicidal or homicidal thoughts you must seek medical attention immediately by calling 911 or calling your MD immediately  if symptoms less severe.  You Must read complete instructions/literature along with all the possible adverse reactions/side effects for all  the Medicines you take and that have been prescribed to you. Take any new Medicines after you have completely understood and accept all the possible adverse reactions/side effects.   Please note  You were cared for by a hospitalist during your hospital stay. If you have any questions about your discharge medications or the care you received while you were in the hospital after you are discharged, you can call the unit and asked to speak with the hospitalist on call if the hospitalist that took care of you is not available. Once you are discharged, your primary  care physician will handle any further medical issues. Please note that NO REFILLS for any discharge medications will be authorized once you are discharged, as it is imperative that you return to your primary care physician (or establish a relationship with a primary care physician if you do not have one) for your aftercare needs so that they can reassess your need for medications and monitor your lab values.    Today   CHIEF COMPLAINT:   Chief Complaint  Patient presents with  . Tracheostomy Tube Change    HISTORY OF PRESENT ILLNESS:  Edgar Taylor  is a 46 y.o. male came in with tracheostomy issue.   VITAL SIGNS:  Blood pressure 127/77, pulse 80, temperature 98 F (36.7 C), temperature source Oral, resp. rate 20, height 5\' 10"  (1.778 m), weight (!) 215.3 kg (474 lb 9.6 oz), SpO2 98 %.    PHYSICAL EXAMINATION:  GENERAL:  46 y.o.-year-old patient lying in the bed with no acute distress.  EYES: Pupils equal, round, reactive to light and accommodation. No scleral icterus. Extraocular muscles intact.  HEENT: Head atraumatic, normocephalic. Oropharynx and nasopharynx clear.  NECK:  Supple, no jugular venous distention. No thyroid enlargement, no tenderness.  LUNGS: Decreased breath sounds bilaterally, no wheezing, rales,rhonchi or crepitation. No use of accessory muscles of respiration.  CARDIOVASCULAR: S1, S2 normal. No murmurs, rubs, or gallops.  ABDOMEN: Soft, non-tender, non-distended. Bowel sounds present. No organomegaly or mass.  EXTREMITIES: 3+ edema, no cyanosis, or clubbing.  NEUROLOGIC: Cranial nerves II through XII are intact. Muscle strength 5/5 in all extremities. Sensation intact. Gait not checked.  PSYCHIATRIC: The patient is alert and oriented x 3.  SKIN: No obvious rash, lesion, or ulcer.   DATA REVIEW:   CBC Recent Labs  Lab 05/30/17 0626  WBC 13.8*  HGB 11.1*  HCT 33.1*  PLT 291    Chemistries  Recent Labs  Lab 05/30/17 0626  NA 138  K 3.9  CL  92*  CO2 41*  GLUCOSE 96  BUN 12  CREATININE 1.02  CALCIUM 9.1     Microbiology Results  Results for orders placed or performed during the hospital encounter of 05/29/17  MRSA PCR Screening     Status: Abnormal   Collection Time: 05/30/17  6:25 AM  Result Value Ref Range Status   MRSA by PCR POSITIVE (A) NEGATIVE Final    Comment:        The GeneXpert MRSA Assay (FDA approved for NASAL specimens only), is one component of a comprehensive MRSA colonization surveillance program. It is not intended to diagnose MRSA infection nor to guide or monitor treatment for MRSA infections. RESULT CALLED TO, READ BACK BY AND VERIFIED WITH: BRANDY FARRELL 05/30/17 @ 0817  MLK     RADIOLOGY:  Dg Chest 2 View  Result Date: 05/29/2017 CLINICAL DATA:  Respiratory failure. EXAM: CHEST  2 VIEW COMPARISON:  None. FINDINGS: Markedly limited study secondary  to patient rotation and to technical limitations from patient's large body habitus. AP projection shows marked rightward rotation displacing cardiomediastinal anatomy over the medial right chest. Cardiopericardial silhouette appears enlarged. There is probably vascular congestion and collapse/ consolidation at the right base cannot be excluded. No gross pleural effusion evident. No gross displaced rib fracture evident. IMPRESSION: 1. Markedly limited study due to positioning and body habitus. 2. Possible collapse/ consolidation at the right lung base. 3. Possible cardiomegaly/pericardial effusion. Electronically Signed   By: Kennith CenterEric  Mansell M.D.   On: 05/29/2017 18:43     Management plans discussed with the patient, and he is in agreement.  CODE STATUS:     Code Status Orders  (From admission, onward)        Start     Ordered   05/29/17 2122  Full code  Continuous     05/29/17 2121    Code Status History    Date Active Date Inactive Code Status Order ID Comments User Context   This patient has a current code status but no historical  code status.      TOTAL TIME TAKING CARE OF THIS PATIENT: 32 minutes.    Alford Highlandichard Blanch Stang M.D on 05/30/2017 at 10:34 AM  Between 7am to 6pm - Pager - [phone]  After 6pm go to [email or web address] - password [email or web address] [facility]  [facility] Office  [phone]  CC: Primary care physician; System, Pcp Not In

## 2017-05-30 NOTE — Clinical Social Work Note (Signed)
Clinical Social Work Assessment  Patient Details  Name: Edgar Taylor MRN: [id number] Date of Birth: 1971-01-25  Date of referral:  05/30/17               Reason for consult:  Other (Comment Required)(From [facility] )                Permission sought to share information with:  Chartered certified accountant granted to share information::  Yes, Verbal Permission Granted  Name::      [facility]   Agency::     Relationship::     Contact Information:     Housing/Transportation Living arrangements for the past 2 months:  [place], [place] of Information:  Patient, Facility Patient Interpreter Needed:  None Criminal Activity/Legal Involvement Pertinent to Current Situation/Hospitalization:  No - Comment as needed Significant Relationships:  Other(Comment)(Godmother ) Lives with:  Facility Resident Do you feel safe going back to the place where you live?  Yes Need for family participation in patient care:  Yes (Comment)  Care giving concerns:  Patient is a short term rehab resident at [facility].    Social Worker assessment / plan:  Holiday representative ([place]) reviewed chart and noted that patient is from Edgar Taylor. Per [employer] liaison patient can return when medically stable. Patient is stable for D/C today. CSW met with patient alone at bedside to address consult. Patient was alert and oriented X4 and was laying in the bed. Patient was very pleasant and answered CSW questions. Per patient he was living in [place] in an apartment when he got sick and went to a local hospital in [place]. Per patient he needed rehab at a SNF and needed trach care. Patient reported that no facility in [place] was able to accept him because of his weight and trach care so he came to Edgar Taylor. Patient is agreeable to return to Edgar Taylor today.  RN will call report and arrange EMS  for transport. CSW sent D/C orders to Edgar Taylor via Edgar Taylor. Patient no longer has a trach. CSW left patient's emergency contact Edgar Taylor a voicemail. Please reconsult if future social work needs arise. CSW signing off.   Employment status:  Disabled (Comment on whether or not currently receiving Disability) Insurance information:  Medicare PT Recommendations:  Not assessed at this time Information / Referral to community resources:  Edgar Taylor  Patient/Family's Response to care:  Patient is agreeable to return to Edgar Taylor.   Patient/Family's Understanding of and Emotional Response to Diagnosis, Current Treatment, and Prognosis:  Patient was very pleasant and thanked CSW for assistance.   Emotional Assessment Appearance:  Appears stated age Attitude/Demeanor/Rapport:    Affect (typically observed):  Accepting, Adaptable, Pleasant Orientation:  Oriented to Self, Oriented to Place, Oriented to  Time, Oriented to Situation Alcohol / Substance use:  Not Applicable Psych involvement (Current and /or in the community):  No (Comment)  Discharge Needs  Concerns to be addressed:  No discharge needs identified Readmission within the last 30 days:  No Current discharge risk:  None Barriers to Discharge:  No Barriers Identified   Edgar Taylor, Edgar Beets, LCSW 05/30/2017, 11:49 AM

## 2017-05-30 NOTE — Care Management Obs Status (Signed)
MEDICARE OBSERVATION STATUS NOTIFICATION   Patient Details  Name: Edgar PanningKenneth Kuroda MRN: [id number] Date of Birth: 10/27/1970   Medicare Observation Status Notification Given:  Yes    Gwenette GreetBrenda S Nkechi Linehan, RN 05/30/2017, 10:57 AM

## 2017-05-30 NOTE — Progress Notes (Signed)
Report called to Elmarie Shileyiffany, Charity fundraiserN at [facility].

## 2017-05-30 NOTE — Final Consult Note (Signed)
.. 05/30/2017 7:47 AM  Marcy Panningunlap, Pink [id number]  Post-Op Day 1    Temp:  [97.9 F (36.6 C)-98.3 F (36.8 C)] 97.9 F (36.6 C) (12/14 0439) Pulse Rate:  [88-120] 88 (12/14 0439) Resp:  [17-29] 22 (12/14 0439) BP: (113-151)/(67-98) 113/71 (12/14 0439) SpO2:  [86 %-100 %] 100 % (12/14 0439) Weight:  [215.3 kg (474 lb 9.6 oz)-225.9 kg (498 lb)] 215.3 kg (474 lb 9.6 oz) (12/13 2051),     Intake/Output Summary (Last 24 hours) at 05/30/2017 0747 Last data filed at 05/30/2017 0547 Gross per 24 hour  Intake 0 ml  Output 1200 ml  Net -1200 ml    Results for orders placed or performed during the hospital encounter of 05/29/17 (from the past 24 hour(s))  CBC with Differential     Status: Abnormal   Collection Time: 05/29/17  2:50 PM  Result Value Ref Range   WBC 13.0 (H) 3.8 - 10.6 K/uL   RBC 3.97 (L) 4.40 - 5.90 MIL/uL   Hemoglobin 11.1 (L) 13.0 - 18.0 g/dL   HCT 78.233.6 (L) 95.640.0 - 21.352.0 %   MCV 84.6 80.0 - 100.0 fL   MCH 27.9 26.0 - 34.0 pg   MCHC 33.0 32.0 - 36.0 g/dL   RDW 08.614.2 57.811.5 - 46.914.5 %   Platelets 344 150 - 440 K/uL   Neutrophils Relative % 73 %   Neutro Abs 9.5 (H) 1.4 - 6.5 K/uL   Lymphocytes Relative 13 %   Lymphs Abs 1.7 1.0 - 3.6 K/uL   Monocytes Relative 11 %   Monocytes Absolute 1.4 (H) 0.2 - 1.0 K/uL   Eosinophils Relative 2 %   Eosinophils Absolute 0.2 0 - 0.7 K/uL   Basophils Relative 1 %   Basophils Absolute 0.1 0 - 0.1 K/uL  Basic metabolic panel     Status: Abnormal   Collection Time: 05/29/17  2:50 PM  Result Value Ref Range   Sodium 136 135 - 145 mmol/L   Potassium 3.4 (L) 3.5 - 5.1 mmol/L   Chloride 90 (L) 101 - 111 mmol/L   CO2 37 (H) 22 - 32 mmol/L   Glucose, Bld 106 (H) 65 - 99 mg/dL   BUN 13 6 - 20 mg/dL   Creatinine, Ser 6.291.13 0.61 - 1.24 mg/dL   Calcium 9.2 8.9 - 52.810.3 mg/dL   GFR calc non Af Amer >60 >60 mL/min   GFR calc Af Amer >60 >60 mL/min   Anion gap 9 5 - 15  Urinalysis, Complete w Microscopic     Status: Abnormal   Collection  Time: 05/29/17  6:33 PM  Result Value Ref Range   Color, Urine YELLOW (A) YELLOW   APPearance CLEAR (A) CLEAR   Specific Gravity, Urine 1.006 1.005 - 1.030   pH 6.0 5.0 - 8.0   Glucose, UA NEGATIVE NEGATIVE mg/dL   Hgb urine dipstick NEGATIVE NEGATIVE   Bilirubin Urine NEGATIVE NEGATIVE   Ketones, ur NEGATIVE NEGATIVE mg/dL   Protein, ur NEGATIVE NEGATIVE mg/dL   Nitrite NEGATIVE NEGATIVE   Leukocytes, UA NEGATIVE NEGATIVE   RBC / HPF 0-5 0 - 5 RBC/hpf   WBC, UA 0-5 0 - 5 WBC/hpf   Bacteria, UA NONE SEEN NONE SEEN   Squamous Epithelial / LPF NONE SEEN NONE SEEN  Basic metabolic panel     Status: Abnormal   Collection Time: 05/30/17  6:26 AM  Result Value Ref Range   Sodium 138 135 - 145 mmol/L   Potassium 3.9 3.5 -  5.1 mmol/L   Chloride 92 (L) 101 - 111 mmol/L   CO2 41 (H) 22 - 32 mmol/L   Glucose, Bld 96 65 - 99 mg/dL   BUN 12 6 - 20 mg/dL   Creatinine, Ser 1.611.02 0.61 - 1.24 mg/dL   Calcium 9.1 8.9 - 09.610.3 mg/dL   GFR calc non Af Amer >60 >60 mL/min   GFR calc Af Amer >60 >60 mL/min   Anion gap 5 5 - 15  CBC     Status: Abnormal (Preliminary result)   Collection Time: 05/30/17  6:26 AM  Result Value Ref Range   WBC 13.8 (H) 3.8 - 10.6 K/uL   RBC 3.90 (L) 4.40 - 5.90 MIL/uL   Hemoglobin 11.1 (L) 13.0 - 18.0 g/dL   HCT 04.533.1 (L) 40.940.0 - 81.152.0 %   MCV 84.8 80.0 - 100.0 fL   MCH 28.5 26.0 - 34.0 pg   MCHC 33.6 32.0 - 36.0 g/dL   RDW 91.414.0 78.211.5 - 95.614.5 %   Platelets PENDING 150 - 400 K/uL    SUBJECTIVE:  No acute events overnight.  Patient tolerating no tracheostomy well.  Using [place].  Reports "slept like a baby"  OBJECTIVE:  GEN-  Obese male in NAD NECK-  Dressing changed with minimal secretions  IMPRESSION:  S/p decannulation  PLAN:  OK to return to facility on [place] oxygen.  Recommend sleep study for OSA.  Damarious Holtsclaw 05/30/2017, 7:47 AM

## 2017-05-31 LAB — HIV ANTIBODY (ROUTINE TESTING W REFLEX): HIV SCREEN 4TH GENERATION: NONREACTIVE

## 2017-10-27 ENCOUNTER — Other Ambulatory Visit: Payer: Self-pay | Admitting: Ophthalmology

## 2017-10-27 DIAGNOSIS — H0520 Unspecified exophthalmos: Secondary | ICD-10-CM

## 2017-10-27 DIAGNOSIS — H5789 Other specified disorders of eye and adnexa: Secondary | ICD-10-CM

## 2017-11-06 ENCOUNTER — Ambulatory Visit
Admission: RE | Admit: 2017-11-06 | Discharge: 2017-11-06 | Disposition: A | Discharge status: Home / Self Care | Payer: Medicare Other | Source: Ambulatory Visit | Attending: Ophthalmology | Admitting: Ophthalmology

## 2017-11-13 ENCOUNTER — Other Ambulatory Visit: Payer: Self-pay | Admitting: Ophthalmology

## 2017-11-13 DIAGNOSIS — H0520 Unspecified exophthalmos: Secondary | ICD-10-CM

## 2017-11-13 DIAGNOSIS — H5789 Other specified disorders of eye and adnexa: Secondary | ICD-10-CM

## 2017-11-26 ENCOUNTER — Ambulatory Visit
Admission: RE | Admit: 2017-11-26 | Discharge: 2017-11-26 | Disposition: A | Discharge status: Home / Self Care | Payer: Medicare Other | Source: Ambulatory Visit | Attending: Ophthalmology | Admitting: Ophthalmology

## 2017-11-26 DIAGNOSIS — H5789 Other specified disorders of eye and adnexa: Secondary | ICD-10-CM

## 2017-11-26 DIAGNOSIS — H579 Unspecified disorder of eye and adnexa: Secondary | ICD-10-CM | POA: Diagnosis not present

## 2017-11-26 DIAGNOSIS — H0520 Unspecified exophthalmos: Secondary | ICD-10-CM | POA: Insufficient documentation

## 2017-11-26 DIAGNOSIS — R234 Changes in skin texture: Secondary | ICD-10-CM | POA: Insufficient documentation

## 2017-11-26 HISTORY — DX: Type 2 diabetes mellitus without complications: E11.9

## 2017-11-26 LAB — POCT I-STAT CREATININE: Creatinine, Ser: 1.2 mg/dL (ref 0.61–1.24)

## 2017-11-26 MED ORDER — IOHEXOL 300 MG/ML  SOLN
75.0000 mL | Freq: Once | INTRAMUSCULAR | Status: AC | PRN
  Administered 2017-11-26: 75 mL via INTRAVENOUS

## 2018-03-16 ENCOUNTER — Encounter: Payer: Self-pay | Admitting: Emergency Medicine

## 2018-03-16 ENCOUNTER — Emergency Department: Payer: Medicare Other

## 2018-03-16 ENCOUNTER — Inpatient Hospital Stay
Admission: EM | Admit: 2018-03-16 | Discharge: 2018-03-20 | DRG: 871 | Disposition: A | Discharge status: Skilled Nursing Facility | Payer: Medicare Other | Attending: Internal Medicine | Admitting: Internal Medicine

## 2018-03-16 ENCOUNTER — Inpatient Hospital Stay (HOSPITAL_COMMUNITY)
Admit: 2018-03-16 | Discharge: 2018-03-16 | Disposition: A | Discharge status: Home / Self Care | Payer: Medicare Other | Attending: Adult Health | Admitting: Adult Health

## 2018-03-16 ENCOUNTER — Inpatient Hospital Stay: Payer: Medicare Other

## 2018-03-16 DIAGNOSIS — Z79899 Other long term (current) drug therapy: Secondary | ICD-10-CM | POA: Diagnosis not present

## 2018-03-16 DIAGNOSIS — I10 Essential (primary) hypertension: Secondary | ICD-10-CM | POA: Diagnosis present

## 2018-03-16 DIAGNOSIS — E1165 Type 2 diabetes mellitus with hyperglycemia: Secondary | ICD-10-CM | POA: Diagnosis present

## 2018-03-16 DIAGNOSIS — R7989 Other specified abnormal findings of blood chemistry: Secondary | ICD-10-CM | POA: Diagnosis present

## 2018-03-16 DIAGNOSIS — I214 Non-ST elevation (NSTEMI) myocardial infarction: Secondary | ICD-10-CM | POA: Diagnosis not present

## 2018-03-16 DIAGNOSIS — E878 Other disorders of electrolyte and fluid balance, not elsewhere classified: Secondary | ICD-10-CM | POA: Diagnosis present

## 2018-03-16 DIAGNOSIS — E662 Morbid (severe) obesity with alveolar hypoventilation: Secondary | ICD-10-CM | POA: Diagnosis present

## 2018-03-16 DIAGNOSIS — I21A1 Myocardial infarction type 2: Secondary | ICD-10-CM | POA: Diagnosis present

## 2018-03-16 DIAGNOSIS — Z452 Encounter for adjustment and management of vascular access device: Secondary | ICD-10-CM

## 2018-03-16 DIAGNOSIS — Z4659 Encounter for fitting and adjustment of other gastrointestinal appliance and device: Secondary | ICD-10-CM

## 2018-03-16 DIAGNOSIS — J440 Chronic obstructive pulmonary disease with acute lower respiratory infection: Secondary | ICD-10-CM | POA: Diagnosis present

## 2018-03-16 DIAGNOSIS — E875 Hyperkalemia: Secondary | ICD-10-CM | POA: Diagnosis present

## 2018-03-16 DIAGNOSIS — I9581 Postprocedural hypotension: Secondary | ICD-10-CM | POA: Diagnosis present

## 2018-03-16 DIAGNOSIS — N179 Acute kidney failure, unspecified: Secondary | ICD-10-CM | POA: Diagnosis present

## 2018-03-16 DIAGNOSIS — J45901 Unspecified asthma with (acute) exacerbation: Secondary | ICD-10-CM | POA: Diagnosis present

## 2018-03-16 DIAGNOSIS — J189 Pneumonia, unspecified organism: Secondary | ICD-10-CM | POA: Diagnosis present

## 2018-03-16 DIAGNOSIS — I248 Other forms of acute ischemic heart disease: Secondary | ICD-10-CM

## 2018-03-16 DIAGNOSIS — R6521 Severe sepsis with septic shock: Secondary | ICD-10-CM | POA: Diagnosis present

## 2018-03-16 DIAGNOSIS — Z6841 Body Mass Index (BMI) 40.0 and over, adult: Secondary | ICD-10-CM

## 2018-03-16 DIAGNOSIS — Y95 Nosocomial condition: Secondary | ICD-10-CM | POA: Diagnosis present

## 2018-03-16 DIAGNOSIS — J9622 Acute and chronic respiratory failure with hypercapnia: Secondary | ICD-10-CM | POA: Diagnosis not present

## 2018-03-16 DIAGNOSIS — J9690 Respiratory failure, unspecified, unspecified whether with hypoxia or hypercapnia: Secondary | ICD-10-CM

## 2018-03-16 DIAGNOSIS — I517 Cardiomegaly: Secondary | ICD-10-CM | POA: Diagnosis not present

## 2018-03-16 DIAGNOSIS — D638 Anemia in other chronic diseases classified elsewhere: Secondary | ICD-10-CM | POA: Diagnosis present

## 2018-03-16 DIAGNOSIS — J9621 Acute and chronic respiratory failure with hypoxia: Secondary | ICD-10-CM | POA: Diagnosis present

## 2018-03-16 DIAGNOSIS — E8809 Other disorders of plasma-protein metabolism, not elsewhere classified: Secondary | ICD-10-CM | POA: Diagnosis present

## 2018-03-16 DIAGNOSIS — E876 Hypokalemia: Secondary | ICD-10-CM | POA: Diagnosis present

## 2018-03-16 DIAGNOSIS — A419 Sepsis, unspecified organism: Principal | ICD-10-CM

## 2018-03-16 DIAGNOSIS — Z7984 Long term (current) use of oral hypoglycemic drugs: Secondary | ICD-10-CM

## 2018-03-16 LAB — GLUCOSE, CAPILLARY
GLUCOSE-CAPILLARY: 84 mg/dL (ref 70–99)
GLUCOSE-CAPILLARY: 105 mg/dL — AB (ref 70–99)
GLUCOSE-CAPILLARY: 63 mg/dL — AB (ref 70–99)
GLUCOSE-CAPILLARY: 61 mg/dL — AB (ref 70–99)
GLUCOSE-CAPILLARY: 66 mg/dL — AB (ref 70–99)
GLUCOSE-CAPILLARY: 94 mg/dL (ref 70–99)
GLUCOSE-CAPILLARY: 108 mg/dL — AB (ref 70–99)
Glucose-Capillary: 108 mg/dL — ABNORMAL HIGH (ref 70–99)
Glucose-Capillary: 123 mg/dL — ABNORMAL HIGH (ref 70–99)
Glucose-Capillary: 133 mg/dL — ABNORMAL HIGH (ref 70–99)
Glucose-Capillary: 98 mg/dL (ref 70–99)

## 2018-03-16 LAB — HEMOGLOBIN A1C
Hgb A1c MFr Bld: 5.5 % (ref 4.8–5.6)
MEAN PLASMA GLUCOSE: 111.15 mg/dL

## 2018-03-16 LAB — CBC
HCT: 31.5 % — ABNORMAL LOW (ref 40.0–52.0)
Hemoglobin: 10.2 g/dL — ABNORMAL LOW (ref 13.0–18.0)
MCH: 27.2 pg (ref 26.0–34.0)
MCHC: 32.3 g/dL (ref 32.0–36.0)
MCV: 84.3 fL (ref 80.0–100.0)
Platelets: 300 10*3/uL (ref 150–440)
RBC: 3.74 MIL/uL — AB (ref 4.40–5.90)
RDW: 14.5 % (ref 11.5–14.5)
WBC: 18.5 10*3/uL — AB (ref 3.8–10.6)

## 2018-03-16 LAB — COMPREHENSIVE METABOLIC PANEL
ALBUMIN: 3.4 g/dL — AB (ref 3.5–5.0)
ALT: 16 U/L (ref 0–44)
ALT: 21 U/L (ref 0–44)
AST: 30 U/L (ref 15–41)
AST: 32 U/L (ref 15–41)
Albumin: 3 g/dL — ABNORMAL LOW (ref 3.5–5.0)
Alkaline Phosphatase: 82 U/L (ref 38–126)
Alkaline Phosphatase: 69 U/L (ref 38–126)
Anion gap: 15 (ref 5–15)
Anion gap: 11 (ref 5–15)
BILIRUBIN TOTAL: 1 mg/dL (ref 0.3–1.2)
BUN: 23 mg/dL — ABNORMAL HIGH (ref 6–20)
BUN: 25 mg/dL — ABNORMAL HIGH (ref 6–20)
CHLORIDE: 87 mmol/L — AB (ref 98–111)
CHLORIDE: 94 mmol/L — AB (ref 98–111)
CO2: 34 mmol/L — ABNORMAL HIGH (ref 22–32)
CO2: 36 mmol/L — AB (ref 22–32)
CREATININE: 1.77 mg/dL — AB (ref 0.61–1.24)
CREATININE: 1.99 mg/dL — AB (ref 0.61–1.24)
Calcium: 8.7 mg/dL — ABNORMAL LOW (ref 8.9–10.3)
Calcium: 8.3 mg/dL — ABNORMAL LOW (ref 8.9–10.3)
GFR calc Af Amer: 45 mL/min — ABNORMAL LOW (ref >60)
GFR calc non Af Amer: 39 mL/min — ABNORMAL LOW (ref >60)
GFR, EST AFRICAN AMERICAN: 52 mL/min — AB (ref >60)
GFR, EST NON AFRICAN AMERICAN: 45 mL/min — AB (ref >60)
GLUCOSE: 110 mg/dL — AB (ref 70–99)
Glucose, Bld: 128 mg/dL — ABNORMAL HIGH (ref 70–99)
POTASSIUM: 4.2 mmol/L (ref 3.5–5.1)
Potassium: 7.2 mmol/L (ref 3.5–5.1)
Sodium: 138 mmol/L (ref 135–145)
Sodium: 139 mmol/L (ref 135–145)
TOTAL PROTEIN: 7.9 g/dL (ref 6.5–8.1)
Total Bilirubin: 1.1 mg/dL (ref 0.3–1.2)
Total Protein: 9.2 g/dL — ABNORMAL HIGH (ref 6.5–8.1)

## 2018-03-16 LAB — PROCALCITONIN: PROCALCITONIN: 0.36 ng/mL

## 2018-03-16 LAB — BLOOD GAS, ARTERIAL
Acid-Base Excess: 13.8 mmol/L — ABNORMAL HIGH (ref 0.0–2.0)
BICARBONATE: 40.3 mmol/L — AB (ref 20.0–28.0)
FIO2: 100
LHR: 25 {breaths}/min
MECHANICAL RATE: 25
O2 SAT: 95.3 %
PEEP/CPAP: 5 cmH2O
Patient temperature: 37
VT: 600 mL
pCO2 arterial: 58 mmHg — ABNORMAL HIGH (ref 32.0–48.0)
pH, Arterial: 7.45 (ref 7.350–7.450)
pO2, Arterial: 74 mmHg — ABNORMAL LOW (ref 83.0–108.0)

## 2018-03-16 LAB — MAGNESIUM: MAGNESIUM: 2.2 mg/dL (ref 1.7–2.4)

## 2018-03-16 LAB — CBC WITH DIFFERENTIAL/PLATELET
BASOS ABS: 0.1 10*3/uL (ref 0–0.1)
Basophils Relative: 0 %
EOS PCT: 1 %
Eosinophils Absolute: 0.1 10*3/uL (ref 0–0.7)
HCT: 35.5 % — ABNORMAL LOW (ref 40.0–52.0)
Hemoglobin: 11.4 g/dL — ABNORMAL LOW (ref 13.0–18.0)
Lymphocytes Relative: 4 %
Lymphs Abs: 0.7 10*3/uL — ABNORMAL LOW (ref 1.0–3.6)
MCH: 27.5 pg (ref 26.0–34.0)
MCHC: 32.2 g/dL (ref 32.0–36.0)
MCV: 85.2 fL (ref 80.0–100.0)
Monocytes Absolute: 1.3 10*3/uL — ABNORMAL HIGH (ref 0.2–1.0)
Monocytes Relative: 7 %
Neutro Abs: 16.3 10*3/uL — ABNORMAL HIGH (ref 1.4–6.5)
Neutrophils Relative %: 88 %
PLATELETS: 354 10*3/uL (ref 150–440)
RBC: 4.16 MIL/uL — AB (ref 4.40–5.90)
RDW: 14.5 % (ref 11.5–14.5)
WBC: 18.5 10*3/uL — AB (ref 3.8–10.6)

## 2018-03-16 LAB — PROTIME-INR
INR: 1.17
Prothrombin Time: 14.8 seconds (ref 11.4–15.2)

## 2018-03-16 LAB — NA AND K (SODIUM & POTASSIUM), RAND UR: POTASSIUM UR: 71 mmol/L

## 2018-03-16 LAB — MRSA PCR SCREENING: MRSA by PCR: POSITIVE — AB

## 2018-03-16 LAB — APTT: aPTT: 30 seconds (ref 24–36)

## 2018-03-16 LAB — TROPONIN I
TROPONIN I: 1.12 ng/mL — AB (ref <0.03)
Troponin I: 0.72 ng/mL (ref <0.03)
Troponin I: 1.02 ng/mL (ref <0.03)

## 2018-03-16 LAB — LACTIC ACID, PLASMA
LACTIC ACID, VENOUS: 2.6 mmol/L — AB (ref 0.5–1.9)
Lactic Acid, Venous: 1.4 mmol/L (ref 0.5–1.9)

## 2018-03-16 LAB — PREALBUMIN: Prealbumin: 14.4 mg/dL — ABNORMAL LOW (ref 18–38)

## 2018-03-16 LAB — ECHOCARDIOGRAM COMPLETE
HEIGHTINCHES: 72 in
Weight: 7840 oz

## 2018-03-16 LAB — PROTEIN, URINE, RANDOM: Total Protein, Urine: 98 mg/dL

## 2018-03-16 LAB — TRIGLYCERIDES: Triglycerides: 73 mg/dL (ref <150)

## 2018-03-16 LAB — CREATININE, URINE, RANDOM: CREATININE, URINE: 228 mg/dL

## 2018-03-16 MED ORDER — DEXMEDETOMIDINE HCL IN NACL 400 MCG/100ML IV SOLN
0.0000 ug/kg/h | INTRAVENOUS | Status: DC
  Administered 2018-03-17 (×3): 1 ug/kg/h via INTRAVENOUS
  Filled 2018-03-16 (×3): qty 100

## 2018-03-16 MED ORDER — PREDNISONE 20 MG PO TABS: 40.0000 mg | ORAL_TABLET | Freq: Every day | ORAL | Status: DC

## 2018-03-16 MED ORDER — ASPIRIN 81 MG PO CHEW
81.0000 mg | CHEWABLE_TABLET | Freq: Every day | ORAL | Status: DC
  Administered 2018-03-16: 81 mg via ORAL
  Filled 2018-03-16: qty 1

## 2018-03-16 MED ORDER — ASPIRIN 81 MG PO CHEW
81.0000 mg | CHEWABLE_TABLET | Freq: Every day | ORAL | Status: DC
  Administered 2018-03-17 – 2018-03-18 (×2): 81 mg
  Filled 2018-03-16 (×2): qty 1

## 2018-03-16 MED ORDER — CHLORHEXIDINE GLUCONATE 0.12% ORAL RINSE (MEDLINE KIT): 15.0000 mL | Freq: Two times a day (BID) | OROMUCOSAL | Status: DC

## 2018-03-16 MED ORDER — NOREPINEPHRINE BITARTRATE 1 MG/ML IV SOLN
0.0000 ug/min | INTRAVENOUS | Status: DC
  Administered 2018-03-16: 10 ug/min via INTRAVENOUS
  Filled 2018-03-16: qty 4

## 2018-03-16 MED ORDER — ATORVASTATIN CALCIUM 20 MG PO TABS: 80.0000 mg | ORAL_TABLET | Freq: Every day | ORAL | Status: DC

## 2018-03-16 MED ORDER — NOREPINEPHRINE 4 MG/250ML-% IV SOLN: 0.0000 ug/min | Freq: Once | INTRAVENOUS | Status: DC

## 2018-03-16 MED ORDER — SODIUM CHLORIDE 0.9 % IV SOLN
2.0000 g | Freq: Two times a day (BID) | INTRAVENOUS | Status: DC
  Filled 2018-03-16 (×2): qty 2

## 2018-03-16 MED ORDER — ONDANSETRON HCL 4 MG/2ML IJ SOLN: 4.0000 mg | Freq: Four times a day (QID) | INTRAMUSCULAR | Status: DC | PRN

## 2018-03-16 MED ORDER — FONDAPARINUX SODIUM 7.5 MG/0.6ML ~~LOC~~ SOLN: 7.5000 mg | Freq: Every day | SUBCUTANEOUS | Status: DC

## 2018-03-16 MED ORDER — FONDAPARINUX SODIUM 2.5 MG/0.5ML ~~LOC~~ SOLN
2.5000 mg | Freq: Every day | SUBCUTANEOUS | Status: DC
  Filled 2018-03-16: qty 0.5

## 2018-03-16 MED ORDER — VANCOMYCIN HCL IN DEXTROSE 1-5 GM/200ML-% IV SOLN
1000.0000 mg | Freq: Once | INTRAVENOUS | Status: AC
  Administered 2018-03-16: 1000 mg via INTRAVENOUS
  Filled 2018-03-16: qty 200

## 2018-03-16 MED ORDER — PREDNISONE 20 MG PO TABS: 30.0000 mg | ORAL_TABLET | Freq: Every day | ORAL | Status: DC

## 2018-03-16 MED ORDER — DEXTROSE 50 % IV SOLN
25.0000 mL | INTRAVENOUS | Status: DC | PRN
  Administered 2018-03-16 – 2018-03-17 (×3): 25 mL via INTRAVENOUS
  Filled 2018-03-16: qty 50

## 2018-03-16 MED ORDER — ORAL CARE MOUTH RINSE
15.0000 mL | OROMUCOSAL | Status: DC
  Administered 2018-03-16 – 2018-03-18 (×22): 15 mL via OROMUCOSAL

## 2018-03-16 MED ORDER — VANCOMYCIN HCL 10 G IV SOLR
1500.0000 mg | INTRAVENOUS | Status: DC
  Administered 2018-03-16: 1500 mg via INTRAVENOUS
  Filled 2018-03-16: qty 1500

## 2018-03-16 MED ORDER — MIDAZOLAM HCL 5 MG/5ML IJ SOLN
2.0000 mg | Freq: Once | INTRAMUSCULAR | Status: AC
  Administered 2018-03-16: 2 mg via INTRAVENOUS

## 2018-03-16 MED ORDER — VITAL HIGH PROTEIN PO LIQD: 1000.0000 mL | ORAL | Status: DC

## 2018-03-16 MED ORDER — ENOXAPARIN SODIUM 40 MG/0.4ML ~~LOC~~ SOLN
40.0000 mg | Freq: Two times a day (BID) | SUBCUTANEOUS | Status: DC
  Administered 2018-03-17 – 2018-03-20 (×7): 40 mg via SUBCUTANEOUS
  Filled 2018-03-16 (×7): qty 0.4

## 2018-03-16 MED ORDER — MUPIROCIN 2 % EX OINT
TOPICAL_OINTMENT | Freq: Two times a day (BID) | CUTANEOUS | Status: DC
  Administered 2018-03-16 – 2018-03-20 (×8): via NASAL
  Filled 2018-03-16 (×2): qty 22

## 2018-03-16 MED ORDER — SODIUM CHLORIDE 0.9 % IV SOLN
2.0000 g | Freq: Three times a day (TID) | INTRAVENOUS | Status: DC
  Administered 2018-03-16 – 2018-03-20 (×12): 2 g via INTRAVENOUS
  Filled 2018-03-16 (×15): qty 2

## 2018-03-16 MED ORDER — FENTANYL 2500MCG IN NS 250ML (10MCG/ML) PREMIX INFUSION
25.0000 ug/h | INTRAVENOUS | Status: DC
  Administered 2018-03-16 (×3): 400 ug/h via INTRAVENOUS
  Administered 2018-03-16: 50 ug/h via INTRAVENOUS
  Administered 2018-03-17: 250 ug/h via INTRAVENOUS
  Filled 2018-03-16 (×4): qty 250

## 2018-03-16 MED ORDER — CHLORHEXIDINE GLUCONATE 0.12% ORAL RINSE (MEDLINE KIT)
15.0000 mL | Freq: Two times a day (BID) | OROMUCOSAL | Status: DC
  Administered 2018-03-16 – 2018-03-18 (×4): 15 mL via OROMUCOSAL

## 2018-03-16 MED ORDER — INSULIN ASPART 100 UNIT/ML ~~LOC~~ SOLN
0.0000 [IU] | SUBCUTANEOUS | Status: DC
  Administered 2018-03-16: 3 [IU] via SUBCUTANEOUS
  Filled 2018-03-16 (×2): qty 1

## 2018-03-16 MED ORDER — FENTANYL CITRATE (PF) 100 MCG/2ML IJ SOLN: 50.0000 ug | Freq: Once | INTRAMUSCULAR | Status: DC

## 2018-03-16 MED ORDER — FONDAPARINUX SODIUM 5 MG/0.4ML ~~LOC~~ SOLN: 5.0000 mg | Freq: Every day | SUBCUTANEOUS | Status: DC

## 2018-03-16 MED ORDER — ADULT MULTIVITAMIN LIQUID CH
15.0000 mL | Freq: Every day | ORAL | Status: DC
  Administered 2018-03-16: 15 mL
  Filled 2018-03-16 (×2): qty 15

## 2018-03-16 MED ORDER — FENTANYL 2500MCG IN NS 250ML (10MCG/ML) PREMIX INFUSION
INTRAVENOUS | Status: AC
  Administered 2018-03-16: 50 ug/h via INTRAVENOUS
  Filled 2018-03-16: qty 250

## 2018-03-16 MED ORDER — DEXTROSE 5 % IV SOLN
INTRAVENOUS | Status: DC
  Administered 2018-03-16 – 2018-03-19 (×5): via INTRAVENOUS

## 2018-03-16 MED ORDER — METHYLPREDNISOLONE SODIUM SUCC 125 MG IJ SOLR
60.0000 mg | Freq: Two times a day (BID) | INTRAMUSCULAR | Status: DC
  Administered 2018-03-16: 60 mg via INTRAVENOUS
  Filled 2018-03-16: qty 2

## 2018-03-16 MED ORDER — SODIUM CHLORIDE 0.9 % IV SOLN
2.0000 g | Freq: Once | INTRAVENOUS | Status: AC
  Administered 2018-03-16: 2 g via INTRAVENOUS
  Filled 2018-03-16: qty 2

## 2018-03-16 MED ORDER — FONDAPARINUX SODIUM 5 MG/0.4ML ~~LOC~~ SOLN
5.0000 mg | Freq: Every day | SUBCUTANEOUS | Status: DC
  Filled 2018-03-16: qty 0.4

## 2018-03-16 MED ORDER — FENTANYL BOLUS VIA INFUSION
50.0000 ug | INTRAVENOUS | Status: DC | PRN
  Filled 2018-03-16: qty 100

## 2018-03-16 MED ORDER — INSULIN ASPART 100 UNIT/ML ~~LOC~~ SOLN: 0.0000 [IU] | Freq: Three times a day (TID) | SUBCUTANEOUS | Status: DC

## 2018-03-16 MED ORDER — SODIUM CHLORIDE 0.9 % IV SOLN: INTRAVENOUS | Status: DC

## 2018-03-16 MED ORDER — FREE WATER
50.0000 mL | Status: DC
  Administered 2018-03-16: 50 mL

## 2018-03-16 MED ORDER — SUCCINYLCHOLINE CHLORIDE 20 MG/ML IJ SOLN
150.0000 mg | Freq: Once | INTRAMUSCULAR | Status: AC
  Administered 2018-03-16: 150 mg via INTRAVENOUS

## 2018-03-16 MED ORDER — MIDAZOLAM HCL 2 MG/2ML IJ SOLN
2.0000 mg | INTRAMUSCULAR | Status: AC | PRN
  Administered 2018-03-16 (×3): 4 mg via INTRAVENOUS
  Filled 2018-03-16 (×2): qty 4

## 2018-03-16 MED ORDER — PREDNISONE 10 MG PO TABS: 5.0000 mg | ORAL_TABLET | Freq: Every day | ORAL | Status: DC

## 2018-03-16 MED ORDER — PREDNISONE 10 MG PO TABS: 10.0000 mg | ORAL_TABLET | Freq: Every day | ORAL | Status: DC

## 2018-03-16 MED ORDER — SODIUM CHLORIDE 0.9 % IV SOLN
INTRAVENOUS | Status: DC | PRN
  Administered 2018-03-16: 10 mL/h via INTRA_ARTERIAL

## 2018-03-16 MED ORDER — VITAL HIGH PROTEIN PO LIQD
1000.0000 mL | ORAL | Status: DC
  Administered 2018-03-16: 1000 mL

## 2018-03-16 MED ORDER — FONDAPARINUX SODIUM 5 MG/0.4ML ~~LOC~~ SOLN
5.0000 mg | Freq: Every day | SUBCUTANEOUS | Status: DC
  Administered 2018-03-16: 5 mg via SUBCUTANEOUS
  Filled 2018-03-16: qty 0.4

## 2018-03-16 MED ORDER — PROPOFOL 1000 MG/100ML IV EMUL
5.0000 ug/kg/min | INTRAVENOUS | Status: DC
  Filled 2018-03-16: qty 100

## 2018-03-16 MED ORDER — ORAL CARE MOUTH RINSE: 15.0000 mL | OROMUCOSAL | Status: DC

## 2018-03-16 MED ORDER — METHYLPREDNISOLONE SODIUM SUCC 40 MG IJ SOLR
20.0000 mg | Freq: Two times a day (BID) | INTRAMUSCULAR | Status: DC
  Administered 2018-03-16 – 2018-03-20 (×8): 20 mg via INTRAVENOUS
  Filled 2018-03-16 (×8): qty 1

## 2018-03-16 MED ORDER — MIDAZOLAM HCL 2 MG/2ML IJ SOLN
2.0000 mg | INTRAMUSCULAR | Status: DC | PRN
  Filled 2018-03-16: qty 4

## 2018-03-16 MED ORDER — PROPOFOL 1000 MG/100ML IV EMUL
INTRAVENOUS | Status: AC
  Filled 2018-03-16: qty 100

## 2018-03-16 MED ORDER — ETOMIDATE 2 MG/ML IV SOLN
30.0000 mg | Freq: Once | INTRAVENOUS | Status: AC
  Administered 2018-03-16: 30 mg via INTRAVENOUS

## 2018-03-16 MED ORDER — SODIUM CHLORIDE 0.9 % IV BOLUS (SEPSIS)
1500.0000 mL | Freq: Once | INTRAVENOUS | Status: AC
  Administered 2018-03-16: 1500 mL via INTRAVENOUS

## 2018-03-16 MED ORDER — VANCOMYCIN HCL 10 G IV SOLR
1750.0000 mg | Freq: Two times a day (BID) | INTRAVENOUS | Status: DC
  Administered 2018-03-16 – 2018-03-18 (×4): 1750 mg via INTRAVENOUS
  Filled 2018-03-16 (×6): qty 1750

## 2018-03-16 MED ORDER — FAMOTIDINE 20 MG PO TABS
20.0000 mg | ORAL_TABLET | Freq: Two times a day (BID) | ORAL | Status: DC
  Administered 2018-03-16 – 2018-03-18 (×4): 20 mg
  Filled 2018-03-16 (×4): qty 1

## 2018-03-16 MED ORDER — PREDNISONE 20 MG PO TABS: 20.0000 mg | ORAL_TABLET | Freq: Every day | ORAL | Status: DC

## 2018-03-16 MED ORDER — BISACODYL 10 MG RE SUPP
10.0000 mg | Freq: Every day | RECTAL | Status: DC | PRN
  Filled 2018-03-16: qty 1

## 2018-03-16 MED ORDER — SENNOSIDES 8.8 MG/5ML PO SYRP
5.0000 mL | ORAL_SOLUTION | Freq: Two times a day (BID) | ORAL | Status: DC | PRN
  Filled 2018-03-16: qty 5

## 2018-03-16 MED ORDER — FAMOTIDINE IN NACL 20-0.9 MG/50ML-% IV SOLN
20.0000 mg | Freq: Two times a day (BID) | INTRAVENOUS | Status: DC
  Administered 2018-03-16 (×3): 20 mg via INTRAVENOUS
  Filled 2018-03-16 (×2): qty 50

## 2018-03-16 MED ORDER — PRO-STAT SUGAR FREE PO LIQD
60.0000 mL | Freq: Three times a day (TID) | ORAL | Status: DC
  Administered 2018-03-16 (×2): 60 mL

## 2018-03-16 MED ORDER — INSULIN ASPART 100 UNIT/ML ~~LOC~~ SOLN: 0.0000 [IU] | Freq: Every day | SUBCUTANEOUS | Status: DC

## 2018-03-16 MED ORDER — PRO-STAT SUGAR FREE PO LIQD
30.0000 mL | Freq: Two times a day (BID) | ORAL | Status: DC
  Administered 2018-03-16: 30 mL

## 2018-03-16 MED ORDER — ATORVASTATIN CALCIUM 20 MG PO TABS
80.0000 mg | ORAL_TABLET | Freq: Every day | ORAL | Status: DC
  Administered 2018-03-16 – 2018-03-17 (×2): 80 mg
  Filled 2018-03-16 (×2): qty 4

## 2018-03-16 MED ORDER — IPRATROPIUM-ALBUTEROL 0.5-2.5 (3) MG/3ML IN SOLN
3.0000 mL | RESPIRATORY_TRACT | Status: DC
  Administered 2018-03-16 – 2018-03-20 (×25): 3 mL via RESPIRATORY_TRACT
  Filled 2018-03-16 (×26): qty 3

## 2018-03-16 MED ORDER — ACETAMINOPHEN 325 MG PO TABS: 650.0000 mg | ORAL_TABLET | ORAL | Status: DC | PRN

## 2018-03-16 NOTE — Progress Notes (Signed)
Pt transported to ICU BED 10 WITHOUT ANY INCIDENT ,

## 2018-03-16 NOTE — ED Notes (Signed)
Lab notified pt difficult stick Needs blood cultures

## 2018-03-16 NOTE — Progress Notes (Signed)
CODE SEPSIS - PHARMACY COMMUNICATION  **Broad Spectrum Antibiotics should be administered within 1 hour of Sepsis diagnosis**  Time Code Sepsis Called/Page Received: 0155  Antibiotics Ordered: vanc/cefepime  Time of 1st antibiotic administration: 0210  Additional action taken by pharmacy:   If necessary, Name of Provider/Nurse Contacted:     Thomasene Ripple ,PharmD Clinical Pharmacist  03/16/2018  3:39 AM

## 2018-03-16 NOTE — Progress Notes (Signed)
*  PRELIMINARY RESULTS* Echocardiogram 2D Echocardiogram has been performed.  Edgar Taylor 03/16/2018, 12:09 PM

## 2018-03-16 NOTE — Progress Notes (Signed)
Pharmacy Antibiotic Note  Edgar Taylor is a 47 y.o. male admitted on 03/16/2018 with acute on chronic respiratory failure. Patient admitted via EMS from [facility]. Patient with past medical history significant for morbid obesity, asthma, diabetes, and hypertension. Pharmacy has been consulted for cefepime and vancomycin dosing. MRSA PCR positive.   Plan: Will increase vancomycin to 1750mg  IV Q12hr for goal trough of 15-20.   Will increase cefepime to 2g IV Q8hr.   Height: 6' (182.9 cm) Weight: (!) 490 lb (222.3 kg) IBW/kg (Calculated) : 77.6  ABW: 135kg  Temp (24hrs), Avg:98.6 F (37 C), Min:98.1 F (36.7 C), Max:98.9 F (37.2 C)  Recent Labs  Lab 03/16/18 0034 03/16/18 0343 03/16/18 0411 03/16/18 0421  WBC 18.5*  --   --  18.5*  CREATININE 1.99* 1.77*  --   --   LATICACIDVEN 2.6*  --  1.4  --     Estimated Creatinine Clearance: 101 mL/min (A) (by C-G formula based on SCr of 1.77 mg/dL (H)).    No Known Allergies  Antimicrobials this admission: Vancomycin 9/30 >>  Cefepime 9/30 >>   Dose adjustments this admission: 9/30 Vancomycin and cefepime adjusted   Microbiology results: 9/30 BCx: no growth < 12 hours  9/30 MRSA PCR: positive   Thank you for allowing pharmacy to be a part of this patient's care.  Simpson,Michael L 03/16/2018 5:28 PM

## 2018-03-16 NOTE — Progress Notes (Signed)
Initial Nutrition Assessment  DOCUMENTATION CODES:   Morbid obesity  INTERVENTION:  Initiate Vital High Protein at 60 mL/hg (1440 mL goal daily volume) + Pro-Stat 60 mL TID via OGT. Provides 2040 kcal, 216 grams of protein, 1210 mL H2O daily.  Provide liquid MVI daily per tube.  Provide free water flush of 50 mL Q4hrs. Total of 1510 mL H2O including water in tube feeding.  NUTRITION DIAGNOSIS:   Inadequate oral intake related to inability to eat as evidenced by NPO status.  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   Patient intubated and sedated. On PRVC mode with FiO2 40% and PEEP 5 cmH2O. Abdomen soft. No family members present at time of RD assessment. Weight appears stable in chart.  Enteral Access: 16 Fr. OGT placed 9/30; terminates in stomach per abdominal x-ray 9/30, 70 cm at corner of mouth  MAP: 74-114 mmHg  Patient is currently intubated on ventilator support Ve: 14 L/min Temp (24hrs), Avg:98.5 F (36.9 C), Min:98.1 F (36.7 C), Max:98.9 F (37.2 C)  Propofol: N/A  Medications reviewed and include: Novolog 0-20 units Q4hrs, Solu-Medrol 60 mg Q12hrs IV, cefepime, famotidine, fentanyl gtt, norepinephrine gtt, vancomycin, propofol gtt off.  Labs reviewed: CBG 28-105, Chloride 94, CO2 34, BUN 25, Creatinine 1.77, HgbA1c 5.5.  I/O: 295 mL UOP overnight  Patient does not meet criteria for malnutrition.  Discussed with RN and on rounds. Plan is to start tube feeds today.  NUTRITION - FOCUSED PHYSICAL EXAM:    Most Recent  Value  Orbital Region  No depletion  Upper Arm Region  No depletion  Thoracic and Lumbar Region  No depletion  Buccal Region  Unable to assess  Temple Region  No depletion  Clavicle Bone Region  No depletion  Clavicle and Acromion Bone Region  No depletion  Scapular Bone Region  Unable to assess  Dorsal Hand  No depletion  Patellar Region  No depletion  Anterior Thigh Region  No depletion  Posterior Calf Region  No depletion  Edema (RD Assessment)  Mild  Hair  Reviewed  Eyes  Unable to assess  Mouth  Unable to assess  Skin  Reviewed  Nails  Reviewed     Diet Order:   Diet Order            Diet NPO time specified  Diet effective now              EDUCATION NEEDS:   No education needs have been identified at this time  Skin:  Skin Assessment: Reviewed RN Assessment  Last BM:  03/16/2018  Height:   Ht Readings from Last 1 Encounters:  03/16/18 6' (1.829 m)    Weight:   Wt Readings from Last 1 Encounters:  03/16/18 (!) 222.3 kg    Ideal Body Weight:  80.9 kg  BMI:  Body mass index is 66.46 kg/m.  Estimated Nutritional Needs:   Kcal:  1780-2023 (22-25 kcal/kg IBW)  Protein:  202 grams (2.5 grams/kg IBW)  Fluid:  2-2.4 L/day (25-30 mL/kg IBW)  Helane Rima, MS, RD, LDN Office: [phone]  Pager: [phone] After Hours/Weekend Pager: [phone]

## 2018-03-16 NOTE — ED Triage Notes (Signed)
EMS pt to rm 25 from [facility]. Called out for unresponsive pt. EMS reports O2 sats of 40% on arrival. Pt bagged and O2 sats up to 98%. EMS obtained BP of 112/85. Unable to obtain IV access.

## 2018-03-16 NOTE — ED Notes (Signed)
Sr Paduchowski attempting ultrasound IV

## 2018-03-16 NOTE — Consult Note (Addendum)
Cardiology Consultation:   Patient ID: Edgar Taylor MRN: [id number]; DOB: [birth year]  Admit date: 03/16/2018 Date of Consult: 03/16/2018  Primary Care Provider: Maryella Shivers, MD Primary Cardiologist: No primary care provider on file. [facility] Primary Electrophysiologist:  None    Patient Profile:   Edgar Taylor is a 47 y.o. male with a hx of HTN, DM2, morbid obesity, asthma, and acute on chronic respiratory failure who is being seen today for the evaluation of elevated troponin at the request of Maura Crandall NP.  History of Present Illness:   Edgar Taylor is a 47 yo AA male with PMH as above, no known cardiac disease, and no primary cardiologist. The patient is currently intubated and non-responsive. The below medical history is thus obtained by reviewing documentation to date in [place].  9/30: Patient reportedly presented via nursing facility after found unresponsive with oxygen saturations in the 40s. Patient reportedly lives at [facility], where EMS was called. Upon arrival, EMS bagged the patient when arriving at the scene, with O2 sats improving to 100%. Per EMS report, patient remained obtunded with only occasional spontaneous movements, such as grabbing at the bag at times.   In the ED and on 9/30 Vitals: HR 130, RR 25, SpO2 100% on ventilator Labs: Na 139, glucose 128, Cr 1.77 (baseline ~1.2), Ca 8.3, Mg 2.2, albumin 3.0, AST 30, ALT 21 Troponin 0.72  1.02  1.12 EKG: Initial EKG 128 bpm, QTc 469 CXR: no acute findings  Patient was admitted to [facility] ICU for further management and evaluation.  Past Medical History:  Diagnosis Date  . Acute and chronic respiratory failure with hypercapnia ([place])   . Asthma   . Diabetes mellitus without complication ([place])   . Hypertension   . Hypokalemia   . Obesity     Past Surgical History:  Procedure Laterality Date  . TRACHEOSTOMY  2018   removed 05/29/2017     Home Medications:  Prior to Admission  medications   Medication Sig Start Date End Date Taking? Authorizing Provider  acetaminophen (TYLENOL) 325 MG tablet Take 650 mg by mouth every 4 (four) hours as needed for mild pain or fever.    [provider]  amLODipine (NORVASC) 5 MG tablet Take 5 mg by mouth daily.    [provider]  enoxaparin (LOVENOX) 40 MG/0.4ML injection Inject 40 mg into the skin daily. 05/20/17   [provider]  famotidine (PEPCID) 20 MG tablet Take 20 mg by mouth at bedtime.    [provider]  levalbuterol Penne Lash) 1.25 MG/3ML nebulizer solution Take 1.25 mg by nebulization every 6 (six) hours as needed for wheezing.    [provider]  metFORMIN (GLUCOPHAGE) 500 MG tablet Take 500 mg by mouth 2 (two) times daily with a meal.    [provider]  Multiple Vitamins-Minerals (MULTIVITAMIN WITH MINERALS) tablet Take 1 tablet by mouth daily.    [provider]  mupirocin ointment (BACTROBAN) 2 % Apply to nose twice a day for 5 days 05/30/17   Loletha Grayer, MD  ondansetron [facility]) 4 MG/2ML SOLN injection Inject 4 mg into the vein every 6 (six) hours as needed for nausea or vomiting.    [provider]  polyethylene glycol (MIRALAX / GLYCOLAX) packet Take 17 g by mouth daily.    [provider]  potassium chloride 20 MEQ TBCR Take 20 mEq by mouth daily. 05/30/17   Loletha Grayer, MD  torsemide (DEMADEX) 20 MG tablet Take 40 mg by mouth daily.  [provider]    Inpatient Medications: Scheduled Meds: . aspirin  81 mg Oral Daily  . chlorhexidine gluconate (MEDLINE KIT)  15 mL Mouth Rinse BID  . feeding supplement (PRO-STAT SUGAR FREE 64)  30 mL Per Tube BID  . feeding supplement (VITAL HIGH PROTEIN)  1,000 mL Per Tube Q24H  . fentaNYL (SUBLIMAZE) injection  50-100 mcg Intravenous Once  . fondaparinux (ARIXTRA) injection  5 mg Subcutaneous Q0600  . insulin aspart  0-20 Units Subcutaneous Q4H  . ipratropium-albuterol  3  mL Nebulization Q4H  . mouth rinse  15 mL Mouth Rinse 10 times per day  . methylPREDNISolone (SOLU-MEDROL) injection  60 mg Intravenous Q12H   Continuous Infusions: . sodium chloride    . ceFEPime (MAXIPIME) IV    . dexmedetomidine (PRECEDEX) IV infusion Stopped (03/16/18 0536)  . famotidine (PEPCID) IV Stopped (03/16/18 0426)  . fentaNYL infusion INTRAVENOUS 400 mcg/hr (03/16/18 0600)  . norepinephrine (LEVOPHED) 51m / 2531minfusion 2 mcg/min (03/16/18 0600)  . propofol (DIPRIVAN) infusion 40 mcg/kg/min (03/16/18 0336)  . vancomycin     PRN Meds: Place/Maintain arterial line **AND** sodium chloride, acetaminophen, bisacodyl, fentaNYL, midazolam, midazolam, ondansetron (ZOFRAN) IV, sennosides  Allergies:   No Known Allergies  Social History:   Social History   Socioeconomic History  . Marital status: Single    Spouse name: Not on file  . Number of children: Not on file  . Years of education: Not on file  . Highest education level: Not on file  Occupational History  . Not on file  Social Needs  . Financial resource strain: Not hard at all  . Food insecurity:    Worry: Never true    Inability: Never true  . Transportation needs:    Medical: No    Non-medical: No  Tobacco Use  . Smoking status: Never Smoker  . Smokeless tobacco: Never Used  Substance and Sexual Activity  . Alcohol use: No    Frequency: Never  . Drug use: No  . Sexual activity: Not Currently  Lifestyle  . Physical activity:    Days per week: 0 days    Minutes per session: 0 min  . Stress: Not at all  Relationships  . Social connections:    Talks on phone: Twice a week    Gets together: Three times a week    Attends religious service: Never    Active member of club or organization: No    Attends meetings of clubs or organizations: Never    Relationship status: Never married  . Intimate partner violence:    Fear of current or ex partner: No    Emotionally abused: No    Physically abused: No     Forced sexual activity: No  Other Topics Concern  . Not on file  Social History Narrative  . Not on file    Family History:   *History reviewed. No pertinent family history.   ROS:  Please see the history of present illness.   All other ROS reviewed and negative.     Physical Exam/Data:   Vitals:   03/16/18 0600 03/16/18 0626 03/16/18 0756 03/16/18 0815  BP: 100/61     Pulse:   85   Resp: (!) 0  (!) 0   Temp:      TempSrc:      SpO2:  100%  100%  Weight:      Height:        Intake/Output Summary (Last 24 hours) at 03/16/2018  3329 Last data filed at 03/16/2018 0600 Gross per 24 hour  Intake 528.12 ml  Output 295 ml  Net 233.12 ml   Filed Weights   03/16/18 0026 03/16/18 0340  Weight: (!) 215 kg (!) 222.3 kg   Body mass index is 66.46 kg/m.  General:  Morbidly obese, sedated, and intubated patient HEENT: normal Lymph: no adenopathy Neck: JVD difficult to assess d/t body habitus  Endocrine:  No thryomegaly Vascular: No carotid bruits; FA pulses 2+ bilaterally without bruits  Cardiac:  normal S1, S2; RRR; no murmur   Lungs:  clear to auscultation bilaterally, no wheezing, rhonchi or rales  Abd: obese, distended, no hepatomegaly  Ext: b/l 1+ LE edema Musculoskeletal:  No deformities Skin: warm and dry  Neuro:  sedated Psych:  sedated  EKG: Refer to HPI Telemetry:  Telemetry was personally reviewed and demonstrates: NSR HR 80s  CV Studies:   Relevant CV Studies: Pending results   Laboratory Data:  Chemistry Recent Labs  Lab 03/16/18 0034 03/16/18 0343  NA 138 139  K 7.2* 4.2  CL 87* 94*  CO2 36* 34*  GLUCOSE 110* 128*  BUN 23* 25*  CREATININE 1.99* 1.77*  CALCIUM 8.7* 8.3*  GFRNONAA 39* 45*  GFRAA 45* 52*  ANIONGAP 15 11    Recent Labs  Lab 03/16/18 0034 03/16/18 0343  PROT 9.2* 7.9  ALBUMIN 3.4* 3.0*  AST 32 30  ALT 16 21  ALKPHOS 82 69  BILITOT 1.1 1.0   Hematology Recent Labs  Lab 03/16/18 0034 03/16/18 0421  WBC 18.5*  18.5*  RBC 4.16* 3.74*  HGB 11.4* 10.2*  HCT 35.5* 31.5*  MCV 85.2 84.3  MCH 27.5 27.2  MCHC 32.2 32.3  RDW 14.5 14.5  PLT 354 300   Cardiac Enzymes Recent Labs  Lab 03/16/18 0034 03/16/18 0419 03/16/18 0617  TROPONINI 0.72* 1.02* 1.12*   No results for input(s): TROPIPOC in the last 168 hours.  BNPNo results for input(s): BNP, PROBNP in the last 168 hours.  DDimer No results for input(s): DDIMER in the last 168 hours.  Radiology/Studies:  Dg Abd 1 View  Result Date: 03/16/2018 CLINICAL DATA:  NG tube placement. EXAM: ABDOMEN - 1 VIEW COMPARISON:  None. FINDINGS: Tip and side port of the enteric tube below the diaphragm in the stomach. No bowel dilatation to suggest obstruction. Body habitus limits more detailed evaluation. IMPRESSION: Tip and side port of the enteric tube below the diaphragm in the stomach. Electronically Signed   By: Keith Rake M.D.   On: 03/16/2018 06:33   Ct Head Wo Contrast  Result Date: 03/16/2018 CLINICAL DATA:  Altered mental status today. EXAM: CT HEAD WITHOUT CONTRAST TECHNIQUE: Contiguous axial images were obtained from the base of the skull through the vertex without intravenous contrast. COMPARISON:  CT orbits November 26, 2017 FINDINGS: Large body habitus results in noisy image quality. BRAIN: No intraparenchymal hemorrhage, mass effect nor midline shift. The ventricles and sulci are normal for age. Patchy supratentorial white matter hypodensities within normal range for patient's age, though non-specific are most compatible with chronic small vessel ischemic disease. No acute large vascular territory infarcts. No abnormal extra-axial fluid collections. Basal cisterns are patent. VASCULAR: Moderate calcific atherosclerosis of the carotid siphons. SKULL: No skull fracture. No significant scalp soft tissue swelling. SINUSES/ORBITS: Lobulated paranasal sinus mucosal thickening. Mastoid air cells are well aerated. Soft tissue effacing LEFT external auditory  canal, most compatible with cerumen.Proptosis from increased orbital fat. Decreased size of RIGHT medial epicanthal  lesion, now measuring less than 1 cm. OTHER: None. IMPRESSION: 1. Negative noncontrast CT HEAD: for age. 2. Decreased size of right epicanthal lesion. Electronically Signed   By: Elon Alas M.D.   On: 03/16/2018 02:03   Dg Chest Port 1 View  Result Date: 03/16/2018 CLINICAL DATA:  Central line placement. EXAM: PORTABLE CHEST 1 VIEW COMPARISON:  Radiograph earlier this day. FINDINGS: Tip of the right internal jugular central venous catheter in the region in the proximal SVC/brachiocephalic confluence. No pneumothorax. Endotracheal tube tip at the thoracic inlet 5.4 cm from the carina. Enteric tube in place not well visualized distal to the lower mediastinum due to soft tissue attenuation from habitus. Unchanged cardiomegaly. Airspace opacity in the periphery of the left mid upper lung zone unchanged from prior exam. No large pleural effusion. IMPRESSION: 1. Tip of the right central line in the region of the proximal SVC/brachiocephalic confluence. No pneumothorax. 2. Again seen cardiomegaly and left upper lobe opacity suspicious for pneumonia. Electronically Signed   By: Keith Rake M.D.   On: 03/16/2018 06:32   Dg Chest Portable 1 View  Result Date: 03/16/2018 CLINICAL DATA:  ETT EXAM: PORTABLE CHEST 1 VIEW COMPARISON:  05/29/2017 FINDINGS: Endotracheal tube terminates 4 cm above the carina. Left upper lobe opacity, suspicious for pneumonia, less likely asymmetric interstitial edema. No definite pleural effusions. No pneumothorax. Cardiomegaly. IMPRESSION: Endotracheal tube terminates 4 cm above the carina. Left upper lobe opacity, suspicious for pneumonia. Electronically Signed   By: Julian Hy M.D.   On: 03/16/2018 01:34    Assessment and Plan:   NSTEMI, likely demand ischemia in the setting of illness / sepsis - Likely troponin elevation in setting of illness and  sinus tachycardia and due to demand ischemia - Troponin 0.72  1.02  1.12. Continue to cycle - EKG without STE  - Continue asa 32m.  - Hypotensive and unable to start on  blocker, recommend start with more stable BP in the future.  - Recommend start atorvastatin 872min the setting of elevated troponin  - Continue to monitor on telemetry - Further management pending results of echo  Hypoxia and Acute on Chronic Respiratory distress and asthma - Consider further workup for PE with hypoxia and sinus tachycardia - Per critical care, pulmonary medicine  HTN -BP 115/75 currently with HR in the 80s -Continue to monitor vitals  DM2 - SSI - Per IM   For questions or updates, please contact [email or web address] Please consult [email or web address] for contact info under     Signed, JaArvil ChacoPA-C  03/16/2018 8:22 AM

## 2018-03-16 NOTE — Procedures (Signed)
Central Venous Catheter Insertion Procedure Note Edgar Taylor [id number] 04-10-1971  Procedure: Insertion of Central Venous Catheter Indications: Assessment of intravascular volume, Drug and/or fluid administration and Frequent blood sampling  Procedure Details Consent: Unable to obtain consent because of emergent medical necessity. Time Out: Verified patient identification, verified procedure, site/side was marked, verified correct patient position, special equipment/implants available, medications/allergies/relevent history reviewed, required imaging and test results available.  Performed  Maximum sterile technique was used including antiseptics, cap, gloves, gown, hand hygiene, mask and sheet. Skin prep: Chlorhexidine; local anesthetic administered A antimicrobial bonded/coated triple lumen catheter was placed in the right internal jugular vein using the Seldinger technique.  Evaluation Blood flow good Complications: No apparent complications Patient did tolerate procedure well. Chest X-ray ordered to verify placement.  CXR: normal.  Procedure performed under direct supervision of Dr.Kasa. Ultrasound utilized for realtime vessel cannulation  [facility] ANP-BC Pulmonary and Critical Care Medicine [facility] Pager [phone] or [phone]  NB: This document was prepared using Dragon voice recognition software and may include unintentional dictation errors.   03/16/2018, 5:33 AM

## 2018-03-16 NOTE — Progress Notes (Signed)
[employer] [facility] [place] [facility]   PATIENT NAME: Edgar Taylor    MR#:  [id number]  DATE OF BIRTH:  [birth year]  SUBJECTIVE:  CHIEF COMPLAINT:   Chief Complaint  Patient presents with  . Respiratory Distress   Morbidly obese, sent from the facility because of respiratory distress.  Under treatment for pneumonia currently, intubated and in ICU care.  Troponin also noted to be slightly high.  REVIEW OF SYSTEMS:  Patient is sedated and on ventilatory support and cannot give review of system. ROS  DRUG ALLERGIES:  No Known Allergies  VITALS:  Blood pressure 115/75, pulse 86, temperature 98.9 F (37.2 C), temperature source Oral, resp. rate (!) 0, height 6' (1.829 m), weight (!) 222.3 kg, SpO2 95 %.  PHYSICAL EXAMINATION:  GENERAL:  47 y.o.-year-old patient lying in the bed critical appearing. EYES: Pupils equal, round, reactive to light . No scleral icterus. Extraocular muscles intact.  HEENT: Head atraumatic, normocephalic. Oropharynx and nasopharynx clear.  ET tube in place. NECK:  Supple, no jugular venous distention. No thyroid enlargement, no tenderness.  LUNGS: Bilateral air entry with ventilatory support and some crepitation.  CARDIOVASCULAR: S1, S2 normal. No murmurs, rubs, or gallops.  ABDOMEN: Soft, nontender, nondistended. Bowel sounds present. No organomegaly or mass.  EXTREMITIES: Bilateral some chronic pedal edema, no cyanosis, or clubbing.  NEUROLOGIC: Patient is sedated on ventilator support. PSYCHIATRIC: The patient is sedated.  SKIN: No obvious rash, lesion, or ulcer.   Physical Exam LABORATORY PANEL:   CBC Recent Labs  Lab 03/16/18 0421  WBC 18.5*  HGB 10.2*  HCT 31.5*  PLT 300   ------------------------------------------------------------------------------------------------------------------  Chemistries  Recent Labs  Lab 03/16/18 0343  NA 139  K 4.2  CL 94*  CO2 34*  GLUCOSE 128*  BUN 25*  CREATININE 1.77*  CALCIUM  8.3*  MG 2.2  AST 30  ALT 21  ALKPHOS 69  BILITOT 1.0   ------------------------------------------------------------------------------------------------------------------  Cardiac Enzymes Recent Labs  Lab 03/16/18 0419 03/16/18 0617  TROPONINI 1.02* 1.12*   ------------------------------------------------------------------------------------------------------------------  RADIOLOGY:  Dg Abd 1 View  Result Date: 03/16/2018 CLINICAL DATA:  NG tube placement. EXAM: ABDOMEN - 1 VIEW COMPARISON:  None. FINDINGS: Tip and side port of the enteric tube below the diaphragm in the stomach. No bowel dilatation to suggest obstruction. Body habitus limits more detailed evaluation. IMPRESSION: Tip and side port of the enteric tube below the diaphragm in the stomach. Electronically Signed   By: Narda Rutherford M.D.   On: 03/16/2018 06:33   Ct Head Wo Contrast  Result Date: 03/16/2018 CLINICAL DATA:  Altered mental status today. EXAM: CT HEAD WITHOUT CONTRAST TECHNIQUE: Contiguous axial images were obtained from the base of the skull through the vertex without intravenous contrast. COMPARISON:  CT orbits November 26, 2017 FINDINGS: Large body habitus results in noisy image quality. BRAIN: No intraparenchymal hemorrhage, mass effect nor midline shift. The ventricles and sulci are normal for age. Patchy supratentorial white matter hypodensities within normal range for patient's age, though non-specific are most compatible with chronic small vessel ischemic disease. No acute large vascular territory infarcts. No abnormal extra-axial fluid collections. Basal cisterns are patent. VASCULAR: Moderate calcific atherosclerosis of the carotid siphons. SKULL: No skull fracture. No significant scalp soft tissue swelling. SINUSES/ORBITS: Lobulated paranasal sinus mucosal thickening. Mastoid air cells are well aerated. Soft tissue effacing LEFT external auditory canal, most compatible with cerumen.Proptosis from increased  orbital fat. Decreased size of RIGHT medial epicanthal lesion, now measuring less than 1  cm. OTHER: None. IMPRESSION: 1. Negative noncontrast CT HEAD: for age. 2. Decreased size of right epicanthal lesion. Electronically Signed   By: Awilda Metro M.D.   On: 03/16/2018 02:03   US Renal  Result Date: 03/16/2018 CLINICAL DATA:  Acute kidney injury EXAM: RENAL / URINARY TRACT ULTRASOUND COMPLETE COMPARISON:  None. FINDINGS: Right Kidney: Length: 10.0 cm. Echogenicity is within normal limits. No mass or hydronephrosis visualized. Left Kidney: Length: 9.3 cm. Echogenicity within normal limits. No mass or hydronephrosis visualized. Bladder: Decompressed with Foley catheter in place, not well visualized. IMPRESSION: No acute findings. Electronically Signed   By: Charlett Nose M.D.   On: 03/16/2018 09:44   Dg Chest Port 1 View  Result Date: 03/16/2018 CLINICAL DATA:  Central line placement. EXAM: PORTABLE CHEST 1 VIEW COMPARISON:  Radiograph earlier this day. FINDINGS: Tip of the right internal jugular central venous catheter in the region in the proximal SVC/brachiocephalic confluence. No pneumothorax. Endotracheal tube tip at the thoracic inlet 5.4 cm from the carina. Enteric tube in place not well visualized distal to the lower mediastinum due to soft tissue attenuation from habitus. Unchanged cardiomegaly. Airspace opacity in the periphery of the left mid upper lung zone unchanged from prior exam. No large pleural effusion. IMPRESSION: 1. Tip of the right central line in the region of the proximal SVC/brachiocephalic confluence. No pneumothorax. 2. Again seen cardiomegaly and left upper lobe opacity suspicious for pneumonia. Electronically Signed   By: Narda Rutherford M.D.   On: 03/16/2018 06:32   Dg Chest Portable 1 View  Result Date: 03/16/2018 CLINICAL DATA:  ETT EXAM: PORTABLE CHEST 1 VIEW COMPARISON:  05/29/2017 FINDINGS: Endotracheal tube terminates 4 cm above the carina. Left upper lobe opacity,  suspicious for pneumonia, less likely asymmetric interstitial edema. No definite pleural effusions. No pneumothorax. Cardiomegaly. IMPRESSION: Endotracheal tube terminates 4 cm above the carina. Left upper lobe opacity, suspicious for pneumonia. Electronically Signed   By: Charline Bills M.D.   On: 03/16/2018 01:34    ASSESSMENT AND PLAN:   Active Problems:   Acute on chronic respiratory failure with hypoxemia (HCC)   Septic shock (HCC)   Non-ST elevation (NSTEMI) myocardial infarction (HCC)   *Sepsis Healthcare associated pneumonia Cultures are sent, started on broad-spectrum antibiotics. Managed per ICU team.  *Acute on chronic respiratory failure Currently intubated on ventilator support Management per critical care team.  *Elevated troponin Echocardiogram shows elevated right-sided pressure. Appreciated held by cardiologist Suggested to continue the current medications and try to rule out pulmonary emboli.  *Acute renal failure Likely secondary to sepsis, continue to monitor.  *Hyperkalemia Albuterol nebs and insulin were given and on repeat potassium level came normal, may be hemolyzed sample.  *History of COPD and presented with respiratory failure Continue steroid and nebulizer therapy.  *Morbid obesity Obesity hypoventilation and pickwickian syndrome He had tracheostomy, removed few months ago.   All the records are reviewed and case discussed with Care Management/Social Workerr. Management plans discussed with the patient, family and they are in agreement.  CODE STATUS: Full.  TOTAL TIME TAKING CARE OF THIS PATIENT: 35 minutes.     POSSIBLE D/C IN 1-2 DAYS, DEPENDING ON CLINICAL CONDITION.   Altamese Dilling M.D on 03/16/2018   Between 7am to 6pm - Pager - [phone]  After 6pm go to [email or web address] - password [email or web address]  [facility]  Office  [phone]  CC: Primary care physician; Charlott Rakes, MD  Note: This  dictation was prepared with Dragon dictation along with smaller  Company secretary. Any transcriptional errors that result from this process are unintentional.

## 2018-03-16 NOTE — ED Provider Notes (Signed)
[facility] Emergency Department Provider Note  Time seen: 12:34 AM  I have reviewed the triage vital signs and the nursing notes.   HISTORY  Chief Complaint Respiratory Distress    HPI Edgar Taylor is a 47 y.o. male with significant morbid obesity history of chronic respiratory failure, diabetes, hypertension, hypokalemia presents from his nursing facility after being found unresponsive with a oxygen saturation of 42.  According EMS report patient lives at [facility] health care, they found the patient to be unresponsive tonight checked vitals found the patient have a room air saturation of 42%.  EMS was called, EMS states saturation initially in the 40s increased to 100% with bagging.  Patient remained obtunded, occasional spontaneous movements, such as grabbing at bag-valve-mask at times.  Patient unable to answer questions, unable to follow commands mostly obtunded.   Past Medical History:  Diagnosis Date  . Acute and chronic respiratory failure with hypercapnia (HCC)   . Asthma   . Diabetes mellitus without complication (HCC)   . Hypertension   . Hypokalemia   . Obesity     Patient Active Problem List   Diagnosis Date Noted  . 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 TRACHEOSTOMY  2018   removed 05/29/2017    Prior to Admission medications   Medication Sig Start Date End Date Taking? Authorizing Provider  acetaminophen (TYLENOL) 325 MG tablet Take 650 mg by mouth every 4 (four) hours as needed for mild pain or fever.    [provider]  amLODipine (NORVASC) 5 MG tablet Take 5 mg by mouth daily.    [provider]  enoxaparin (LOVENOX) 40 MG/0.4ML injection Inject 40 mg into the skin daily. 05/20/17   [provider]  famotidine (PEPCID) 20 MG tablet Take 20 mg by mouth at bedtime.    [provider]  levalbuterol Pauline Aus) 1.25 MG/3ML nebulizer solution  Take 1.25 mg by nebulization every 6 (six) hours as needed for wheezing.    [provider]  metFORMIN (GLUCOPHAGE) 500 MG tablet Take 500 mg by mouth 2 (two) times daily with a meal.    [provider]  Multiple Vitamins-Minerals (MULTIVITAMIN WITH MINERALS) tablet Take 1 tablet by mouth daily.    [provider]  mupirocin ointment (BACTROBAN) 2 % Apply to nose twice a day for 5 days 05/30/17   Alford Highland, MD  ondansetron [facility]) 4 MG/2ML SOLN injection Inject 4 mg into the vein every 6 (six) hours as needed for nausea or vomiting.    [provider]  polyethylene glycol (MIRALAX / GLYCOLAX) packet Take 17 g by mouth daily.    [provider]  potassium chloride 20 MEQ TBCR Take 20 mEq by mouth daily. 05/30/17   Alford Highland, MD  torsemide (DEMADEX) 20 MG tablet Take 40 mg by mouth daily.    [provider]    No Known Allergies  History reviewed. No pertinent family history.  Social History Social History   Tobacco Use  . Smoking status: Never Smoker  . Smokeless tobacco: Never Used  Substance Use Topics  . Alcohol use: No    Frequency: Never  . Drug use: No    Review of Systems Unable to obtain an adequate/accurate review of systems secondary to altered mental status/unresponsiveness. ____________________________________________   PHYSICAL EXAM:  Constitutional: Patient is obtunded, eyes are open but not making eye contact, not following commands or answering questions, occasional spontaneous purposeful movements.  Severely morbidly obese. Eyes: Edematous eyes.  Pupils appear to be reactive approximately 3 mm in size. ENT   Head: Normocephalic and atraumatic.   Mouth/Throat: Mucous membranes are moist. Cardiovascular: Normal rate, regular rhythm. Respiratory: Patient has agonal respirations without bagging.  With bagging patient has good air movement bilaterally. Gastrointestinal: Soft, no significant  distention.  Morbidly obese. Musculoskeletal: No signs of trauma on extremities. Neurologic: Unresponsive, occasional spontaneous purposeful movement.  Does appear to move all extremities at times. Skin:  Skin is warm, dry and intact.  Psychiatric: Unresponsive/obtunded  ____________________________________________    EKG  EKG reviewed and interpreted by myself shows sinus tachycardia at 128 bpm, narrow QRS, normal axis, largely normal intervals with nonspecific ST changes without ST elevation.  ____________________________________________    RADIOLOGY  CT negative Chest x-ray consistent with pneumonia  ____________________________________________   INITIAL IMPRESSION / ASSESSMENT AND PLAN / ED COURSE  Pertinent labs & imaging results that were available during my care of the patient were reviewed by me and considered in my medical decision making (see chart for details).  Patient presents to the emergency department with unresponsiveness, found unresponsive at his nursing home tonight with a room air saturation and in the 40s.  Upon arrival patient's saturation had increased to 100% on 100% O2 with bag-valve-mask bagging.  Agonal spontaneous respirations.  Largely obtunded, not following commands or answering questions.  Occasional purposeful movements.  Patient has a history of respiratory failure in the past, per record review had a tracheostomy removed in 2018.  Patient has been intubated by myself upon arrival.  We will check labs obtain CT imaging of the head, chest x-ray and continue to closely monitor.  Patient will require admission to the ICU once ER work-up is been completed.  We will place the patient on propofol for sedation.  She became hypotensive after starting propofol.  I placed an ultrasound-guided 18-gauge peripheral IV.  We will dose IV fluids.  We will place the patient on a small amount of levo fed peripherally at this time.  I am not 100% sure that the blood  pressures are accurate as the patient is extremely obese and this is a wrist blood pressure with an obese cuff.  Labs are pending.  ABG is reassuring PCO2 is 58, pH 7.45.  Awaiting x-ray and CT results.  We will place the patient back on propofol with levo fed to help increase blood pressure, again it is not entirely clear if the blood pressures are accurate patient maintains warm extremities.  After turning the propofol off he did begin to reach for his endotracheal tube, which is also reassuring.  Even while reaching for the endotracheal tube blood pressures remained in the 70s, thus again I do not believe that they are 100% accurate.  Have resulted showing a potassium of 7.2 however this was hemolyzed, receiving significant IV fluids.  White blood cell count is 18,000 could be stress-induced versus infectious.  Chest x-ray consistent with pneumonia we will cover for H CAP.  I discussed with the hospitalist they will be admitting to their service.  Blood pressure currently 109/68 with a minimal amount of levo fed.  Troponin is elevated 0.7, this could either be an STEMI versus demand ischemia.  Patient reportedly receiving Lovenox injections we will hold off on heparin until second troponin has resulted.  Overall this is an extremely difficult case largely limited due to body habitus.  I suspect the most likely cause of presentation is due to pneumonia leading  to respiratory failure.  Patient is intubated received IV antibiotics, IV pressors titrated as needed will be admitted to the ICU.   INTUBATION Performed by: Minna Antis  Required items: required blood products, implants, devices, and special equipment available Patient identity confirmed: provided demographic data and hospital-assigned identification number Time out: Immediately prior to procedure a "time out" was called to verify the correct patient, procedure, equipment, support staff and site/side marked as required.  Indications:  Respiratory failure  Intubation method: 4 Glidescope Laryngoscopy   Preoxygenation: 100% BVM  Sedatives: 30 mg etomidate Paralytic: 150 mg succinylcholine  Tube Size: 7.5 cuffed  Post-procedure assessment: chest rise and ETCO2 monitor Breath sounds: equal and absent over the epigastrium Tube secured with: ETT holder Chest x-ray interpreted by radiologist and me.  Chest x-ray findings: endotracheal tube in appropriate position  Patient tolerated the procedure well with no immediate complications.  CRITICAL CARE Performed by: Minna Antis   Total critical care time: 60 minutes  Critical care time was exclusive of separately billable procedures and treating other patients.  Critical care was necessary to treat or prevent imminent or life-threatening deterioration.  Critical care was time spent personally by me on the following activities: development of treatment plan with patient and/or surrogate as well as nursing, discussions with consultants, evaluation of patient's response to treatment, examination of patient, obtaining history from patient or surrogate, ordering and performing treatments and interventions, ordering and review of laboratory studies, ordering and review of radiographic studies, pulse oximetry and re-evaluation of patient's condition.    ____________________________________________   FINAL CLINICAL IMPRESSION(S) / ED DIAGNOSES  Unresponsiveness Respiratory failure    Minna Antis, MD 03/16/18 [phone]

## 2018-03-16 NOTE — Consult Note (Signed)
Name: Edgar Taylor MRN: [id number] DOB: 1971/01/31    LOS: 0  Referring Provider: Dr. Marjie Skiff Reason for Referral: Acute hypoxic respiratory failure  PULMONARY / CRITICAL CARE MEDICINE   Brief patient description: This is 47 year old African-American male with morbid obesity and possible obesity hypoventilation syndrome, chronic respiratory failure, asthma, type 2 diabetes, and hypertension who presented to the ED after being found unresponsive and severely hypoxic, subsequently intubated in the ED.  Chest x-ray suggested right upper lobe pneumonia  HPI: This is a 47 year old African-American male, SNF resident, history of morbid obesity, hypertension, hyperlipidemia, chronic respiratory failure, asthma, type 2 diabetes and possible obesity hypoventilation syndrome who presented to the ED after being found by facility staff unresponsive with SPO2 in the 40s.  EMS was called and with BMV ventilation his SPO2 increased to the 90s.  He was intubated upon arrival in the ED.  His ABG post intubation showed mild hypercarbia and hypoxia.  His respiratory initially showed a potassium of 7.2 but the sample was hemolyzed.  Repeat  chemistry showed a creatinine of 1.77.  His WBC was 18 . 5K.  He is being admitted to the ICU for management of acute on chronic respiratory failure and healthcare acquired pneumonia  Events Since Admission: 03/16/2018: Admitted to the ICU  Past Medical History:  Diagnosis Date  . Acute and chronic respiratory failure with hypercapnia (HCC)   . Asthma   . Diabetes mellitus without complication (HCC)   . Hypertension   . Hypokalemia   . Obesity    Past Surgical History:  Procedure Laterality Date  . TRACHEOSTOMY  2018   removed 05/29/2017   Prior to Admission medications   Medication Sig Start Date End Date Taking? Authorizing Provider  acetaminophen (TYLENOL) 325 MG tablet Take 650 mg by mouth every 4 (four) hours as needed for mild pain or fever.    [provider]  amLODipine (NORVASC) 5 MG tablet Take 5 mg by mouth daily.    [provider]  enoxaparin (LOVENOX) 40 MG/0.4ML injection Inject 40 mg into the skin daily. 05/20/17   [provider]  famotidine (PEPCID) 20 MG tablet Take 20 mg by mouth at bedtime.    [provider]  levalbuterol Pauline Aus) 1.25 MG/3ML nebulizer solution Take 1.25 mg by nebulization every 6 (six) hours as needed for wheezing.    [provider]  metFORMIN (GLUCOPHAGE) 500 MG tablet Take 500 mg by mouth 2 (two) times daily with a meal.    [provider]  Multiple Vitamins-Minerals (MULTIVITAMIN WITH MINERALS) tablet Take 1 tablet by mouth daily.    [provider]  mupirocin ointment (BACTROBAN) 2 % Apply to nose twice a day for 5 days 05/30/17   Alford Highland, MD  ondansetron [facility]) 4 MG/2ML SOLN injection Inject 4 mg into the vein every 6 (six) hours as needed for nausea or vomiting.    [provider]  polyethylene glycol (MIRALAX / GLYCOLAX) packet Take 17 g by mouth daily.    [provider]  potassium chloride 20 MEQ TBCR Take 20 mEq by mouth daily. 05/30/17   Alford Highland, MD  torsemide (DEMADEX) 20 MG tablet Take 40 mg by mouth daily.    [provider]   Allergies No Known Allergies  Family History History reviewed. No pertinent family history. Social History  reports that he has never smoked. He has never used smokeless tobacco. He reports that he does not drink alcohol or use drugs.  Review Of Systems:  Unable to obtain as patient is intubated and sedated  Vital Signs: Pulse Rate:  [68-130] 68 (09/30 0210) Resp:  [0-25] 14 (09/30 0300) BP: (74-121)/(50-92) 121/81 (09/30 0300) SpO2:  [89 %-100 %] 100 % (09/30 0210) FiO2 (%):  [100 %] 100 % (09/30 0025) Weight:  [132 kg] 215 kg (09/30 0026)  VENTILATOR SETTINGS: Vent Mode: AC FiO2 (%):  [100 %] 100 % Set Rate:  [25 bmp] 25 bmp Vt Set:  [600 mL] 600  mL PEEP:  [5 cmH20] 5 cmH20  INTAKE / OUTPUT: No intake/output data recorded.  PHYSICAL EXAMINATION: General: Morbidly obese, sedated HEENT: PERRLA, corneal reflexes intact, right eye stye red with minimal discharge, trachea midline, no JVD, neck is short Neuro: Opens eyes and withdraws to noxious stimulus with occasional agitation Cardiovascular: Apical pulse regular, S2, no murmur regurg or gallop, +2 pulses, +2 lower extremity edema Lungs: Bilateral breath sounds, faint due to poor decubitus, no wheezing appreciated Abdomen: Profoundly obese, hypoactive bowel sounds Musculoskeletal: Positive range of motion upper and lower extremities, no deformities Skin: Warm and dry  LABS:  BMET Recent Labs  Lab 03/16/18 0034  NA 138  K 7.2*  CL 87*  CO2 36*  BUN 23*  CREATININE 1.99*  GLUCOSE 110*    Electrolytes Recent Labs  Lab 03/16/18 0034  CALCIUM 8.7*    CBC Recent Labs  Lab 03/16/18 0034  WBC 18.5*  HGB 11.4*  HCT 35.5*  PLT 354    Coag's No results for input(s): APTT, INR in the last 168 hours.  Sepsis Markers Recent Labs  Lab 03/16/18 0034  LATICACIDVEN 2.6*    ABG Recent Labs  Lab 03/16/18 0058  PHART 7.45  PCO2ART 58*  PO2ART 74*    Liver Enzymes Recent Labs  Lab 03/16/18 0034  AST 32  ALT 16  ALKPHOS 82  BILITOT 1.1  ALBUMIN 3.4*    Cardiac Enzymes Recent Labs  Lab 03/16/18 0034  TROPONINI 0.72*    Glucose No results for input(s): GLUCAP in the last 168 hours.  Imaging Ct Head Wo Contrast  Result Date: 03/16/2018 CLINICAL DATA:  Altered mental status today. EXAM: CT HEAD WITHOUT CONTRAST TECHNIQUE: Contiguous axial images were obtained from the base of the skull through the vertex without intravenous contrast. COMPARISON:  CT orbits November 26, 2017 FINDINGS: Large body habitus results in noisy image quality. BRAIN: No intraparenchymal hemorrhage, mass effect nor midline shift. The ventricles and sulci are normal for age.  Patchy supratentorial white matter hypodensities within normal range for patient's age, though non-specific are most compatible with chronic small vessel ischemic disease. No acute large vascular territory infarcts. No abnormal extra-axial fluid collections. Basal cisterns are patent. VASCULAR: Moderate calcific atherosclerosis of the carotid siphons. SKULL: No skull fracture. No significant scalp soft tissue swelling. SINUSES/ORBITS: Lobulated paranasal sinus mucosal thickening. Mastoid air cells are well aerated. Soft tissue effacing LEFT external auditory canal, most compatible with cerumen.Proptosis from increased orbital fat. Decreased size of RIGHT medial epicanthal lesion, now measuring less than 1 cm. OTHER: None. IMPRESSION: 1. Negative noncontrast CT HEAD: for age. 2. Decreased size of right epicanthal lesion. Electronically Signed   By: Awilda Metro M.D.   On: 03/16/2018 02:03   Dg Chest Portable 1 View  Result Date: 03/16/2018 CLINICAL DATA:  ETT EXAM: PORTABLE CHEST 1 VIEW COMPARISON:  05/29/2017 FINDINGS: Endotracheal tube terminates 4 cm above the carina. Left upper lobe opacity, suspicious for pneumonia, less likely asymmetric interstitial edema. No definite pleural effusions. No pneumothorax.  Cardiomegaly. IMPRESSION: Endotracheal tube terminates 4 cm above the carina. Left upper lobe opacity, suspicious for pneumonia. Electronically Signed   By: Charline Bills M.D.   On: 03/16/2018 01:34    STUDIES:  None  CULTURES: Blood cultures x2 MRSA positive  ANTIBIOTICS: Vancomycin Cefepime  LINES/TUBES: Right IJ Peripheral IVs ETT  DISCUSSION: 47 year old African-American male presenting with acute on chronic respiratory failure secondary to pneumonia  ASSESSMENT Acute on chronic respiratory failure requiring intubation Healthcare acquired pneumonia Septic shock Acute renal failure-baseline creatinine 1.1; now 1.77 Possible obesity hypoventilation syndrome and  obstructive sleep apnea Type 2 diabetes mellitus Hypertension Asthma   PLAN Hemodynamic monitoring per ICU protocol Full vent support with current settings Chest x-ray and ABG post intubation reviewed Antibiotics as above Trend procalcitonin Trend creatinine Trend electrolytes and correct appropriately Weaning trials as tolerated NG to low intermittent suction ABG and chest x-ray as needed Nebulized bronchodilators  IV prednisone Fentanyl, Precedex and as needed Versed for vent sedation and discomfort Blood glucose monitoring with sliding scale insulin coverage Hemoglobin A1c with a.m. Labs  Best Practice: Code Status:  Full code Diet: N.p.o. GI prophylaxis:  VTE prophylaxis:  SCD's / arixtra  FAMILY  - Updates: no family at bedside. Will update when available.   Edgar Taylor S. [facility] ANP-BC Pulmonary and Critical Care Medicine [facility] Pager [phone] or [phone]  NB: This document was prepared using Dragon voice recognition software and may include unintentional dictation errors.   03/16/2018, 3:22 AM

## 2018-03-16 NOTE — Progress Notes (Addendum)
Pharmacy Antibiotic Note  Edgar Taylor is a 47 y.o. male admitted on 03/16/2018 with pneumonia.  Pharmacy has been consulted for vanc/cefepime dosing. Patient received vanc 1g and cefepime 2g IV x 1.  Plan: Will continue vanc 1.5g IV q24h w/ 6 hour stack  Will draw trough prior to 3rd dose to ensure therapeutic trough Will continue cefepime 2g IV q12h   [name] doesn't matter at this point because it's a complete overestimation.  Weight: (!) 490 lb (222.3 kg)  No data recorded.  Recent Labs  Lab 03/16/18 0034  WBC 18.5*  CREATININE 1.99*  LATICACIDVEN 2.6*    CrCl cannot be calculated (Unknown ideal weight.).    No Known Allergies  Thank you for allowing pharmacy to be a part of this patient's care.  Thomasene Ripple, PharmD, BCPS Clinical Pharmacist 03/16/2018

## 2018-03-16 NOTE — H&P (Addendum)
[facility] [place] [facility]   PATIENT NAME: Edgar Taylor    MR#:  [id number]  DATE OF BIRTH:  22-Jan-1971  DATE OF ADMISSION:  03/16/2018  PRIMARY CARE PHYSICIAN: System, Pcp Not In   REQUESTING/REFERRING PHYSICIAN: Minna Antis, MD  CHIEF COMPLAINT:   Chief Complaint  Patient presents with  . Respiratory Distress    HISTORY OF PRESENT ILLNESS:  Edgar Taylor  is a 47 y.o. 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 Pt is already intubated at the time of my assessment. Hx/ROS unobtainable from pt. Information is obtained from ED provider/staff and documentation. Pt is an absolute unit. I am in awe at the size of this lad. My understanding is that he resides at [facility] for this reason alone. This does not surprise me, as I would have difficulty expecting a man of his habitus to engage in effective personal hygiene and self-care without assistance. As I understand, he also used to have a tracheostomy, up until late (December) 2018, when he was admitted to [facility] for dislodged tracheostomy tube. It was decided at that time to not replace it.  With regards to the present admission: I am told that staff at the healthcare facility were rounding, and found the pt poorly-responsive, w/ SpO2 in the 40% range. EMS was called. Pt found to be breathing agonally, obtunded/stuporous, intubated. CXR demonstrates pneumonia. WBC 18.5, Lactate 2.6. SIRS (+). Hypotensive 2/2 sedation, on Levophed. Admit to ICU for acute on chronic hypoxemic respiratory failure, acute asthma exacerbation, SIRS, sepsis, HCAP.  PAST MEDICAL HISTORY:   Past Medical History:  Diagnosis Date  . Acute and chronic respiratory failure with hypercapnia (HCC)   . Asthma   .  Diabetes mellitus without complication (HCC)   . Hypertension   . Hypokalemia   . Obesity     PAST SURGICAL HISTORY:   Past Surgical History:  Procedure Laterality Date  . TRACHEOSTOMY  2018   removed 05/29/2017    SOCIAL HISTORY:   Social History   Tobacco Use  . Smoking status: Never Smoker  . Smokeless tobacco: Never Used  Substance Use Topics  . Alcohol use: No    Frequency: Never    FAMILY HISTORY:  History reviewed. No pertinent family history.  DRUG ALLERGIES:  No Known Allergies  REVIEW OF SYSTEMS:   Review of Systems  Unable to perform ROS: Intubated  Respiratory: Positive for shortness of breath.    AMS/obtundation, intubated. MEDICATIONS AT HOME:   Prior to Admission medications   Medication Sig Start Date End Date Taking? Authorizing Provider  acetaminophen (TYLENOL) 325 MG tablet Take 650 mg by mouth every 4 (four) hours as needed for mild pain or fever.    [provider]  amLODipine (NORVASC) 5 MG tablet Take 5 mg by mouth daily.    [provider]  enoxaparin (LOVENOX) 40 MG/0.4ML injection Inject 40 mg into the skin daily. 05/20/17   [provider]  famotidine (PEPCID) 20 MG tablet Take 20 mg by mouth at bedtime.    [provider]  levalbuterol Pauline Aus) 1.25 MG/3ML nebulizer solution Take 1.25 mg by nebulization every 6 (six) hours as needed for wheezing.    [provider]  metFORMIN (GLUCOPHAGE) 500 MG tablet Take 500 mg by mouth 2 (two) times daily with a  meal.    [provider]  Multiple Vitamins-Minerals (MULTIVITAMIN WITH MINERALS) tablet Take 1 tablet by mouth daily.    [provider]  mupirocin ointment (BACTROBAN) 2 % Apply to nose twice a day for 5 days 05/30/17   Alford Highland, MD  ondansetron [facility]) 4 MG/2ML SOLN injection Inject 4 mg into the vein every 6 (six) hours as needed for nausea or vomiting.    [provider]  polyethylene glycol (MIRALAX /  GLYCOLAX) packet Take 17 g by mouth daily.    [provider]  potassium chloride 20 MEQ TBCR Take 20 mEq by mouth daily. 05/30/17   Alford Highland, MD  torsemide (DEMADEX) 20 MG tablet Take 40 mg by mouth daily.    [provider]      VITAL SIGNS:  Blood pressure 121/81, pulse 68, resp. rate 14, height 6' (1.829 m), weight (!) 222.3 kg, SpO2 100 %.  PHYSICAL EXAMINATION:  Physical Exam  Constitutional: He appears well-developed and well-nourished. He is sedated and intubated.  HENT:  Head: Normocephalic and atraumatic.  Eyes: Conjunctivae and lids are normal. No scleral icterus.  Neck: Neck supple. No JVD present. No thyromegaly present.  Cardiovascular: Normal rate, regular rhythm, S1 normal, S2 normal and normal heart sounds.  No extrasystoles are present. Exam reveals no gallop, no distant heart sounds and no friction rub.  No murmur heard. Pulmonary/Chest: No accessory muscle usage or stridor. No apnea, no tachypnea and no bradypnea. He is intubated. He has decreased breath sounds in the right upper field, the right middle field, the right lower field, the left upper field, the left middle field and the left lower field. He has wheezes in the right upper field, the right middle field, the right lower field, the left upper field and the left middle field. He has rhonchi in the left lower field. He has no rales.  Abdominal: Soft. He exhibits no distension. Bowel sounds are decreased. There is no tenderness. There is no rigidity, no rebound and no guarding.  Musculoskeletal: He exhibits no edema.  Lymphadenopathy:    He has no cervical adenopathy.  Neurological: He is unresponsive.  Intubated/sedated.  Skin: Skin is warm and dry. No rash noted. He is not diaphoretic.  Psychiatric: He is noncommunicative.  Intubated/sedated.   Diffusely diminished, diffuse mild end-expiratory wheezing, L base coarse rhonchi. LABORATORY PANEL:   CBC Recent Labs  Lab  03/16/18 0034  WBC 18.5*  HGB 11.4*  HCT 35.5*  PLT 354   ------------------------------------------------------------------------------------------------------------------  Chemistries  Recent Labs  Lab 03/16/18 0034  NA 138  K 7.2*  CL 87*  CO2 36*  GLUCOSE 110*  BUN 23*  CREATININE 1.99*  CALCIUM 8.7*  AST 32  ALT 16  ALKPHOS 82  BILITOT 1.1   ------------------------------------------------------------------------------------------------------------------  Cardiac Enzymes Recent Labs  Lab 03/16/18 0034  TROPONINI 0.72*   ------------------------------------------------------------------------------------------------------------------  RADIOLOGY:  Ct Head Wo Contrast  Result Date: 03/16/2018 CLINICAL DATA:  Altered mental status today. EXAM: CT HEAD WITHOUT CONTRAST TECHNIQUE: Contiguous axial images were obtained from the base of the skull through the vertex without intravenous contrast. COMPARISON:  CT orbits November 26, 2017 FINDINGS: Large body habitus results in noisy image quality. BRAIN: No intraparenchymal hemorrhage, mass effect nor midline shift. The ventricles and sulci are normal for age. Patchy supratentorial white matter hypodensities within normal range for patient's age, though non-specific are most compatible with chronic small vessel ischemic disease. No acute large vascular territory infarcts. No abnormal extra-axial fluid  collections. Basal cisterns are patent. VASCULAR: Moderate calcific atherosclerosis of the carotid siphons. SKULL: No skull fracture. No significant scalp soft tissue swelling. SINUSES/ORBITS: Lobulated paranasal sinus mucosal thickening. Mastoid air cells are well aerated. Soft tissue effacing LEFT external auditory canal, most compatible with cerumen.Proptosis from increased orbital fat. Decreased size of RIGHT medial epicanthal lesion, now measuring less than 1 cm. OTHER: None. IMPRESSION: 1. Negative noncontrast CT HEAD: for age. 2.  Decreased size of right epicanthal lesion. Electronically Signed   By: Awilda Metro M.D.   On: 03/16/2018 02:03   Dg Chest Portable 1 View  Result Date: 03/16/2018 CLINICAL DATA:  ETT EXAM: PORTABLE CHEST 1 VIEW COMPARISON:  05/29/2017 FINDINGS: Endotracheal tube terminates 4 cm above the carina. Left upper lobe opacity, suspicious for pneumonia, less likely asymmetric interstitial edema. No definite pleural effusions. No pneumothorax. Cardiomegaly. IMPRESSION: Endotracheal tube terminates 4 cm above the carina. Left upper lobe opacity, suspicious for pneumonia. Electronically Signed   By: Charline Bills M.D.   On: 03/16/2018 01:34   IMPRESSION AND PLAN:   A/P: 83M acute on chronic hypoxemic respiratory failure, acute asthma exacerbation, AMS/obtundation, SIRS, sepsis, HCAP, lactate elevation. Hyperkalemia, hypochloremia, hyperglycemia (w/ T2NIDDM), AKI, hypocalcemia, hypoalbuminemia, Troponin elevation, leukocytosis, normocytic anemia. -Acute on chronic hypoxemic respiratory failure, acute asthma exacerbation, SIRS, sepsis, HCAP, lactate elevation: EMS called to [facility] for AMS/obtundation, SOB/hypoxia. Intubated. ABG demonstrates hypoxemia w/o hypercapnoeia. Diffuse mild end-expiratory wheezing + LLL rhonchi on lung exam. CXR (+) pneumonia. Tachypneic, (+) leukocytosis (18.5), SIRS (+), (+) sepsis. Lactate 2.6. PCT, BCx, Sputum Cx/gram, UStrep + ULegionella Ag pending. Given pt's residence at healthcare facility, paired with acuity/severity of presentation, pt covered w/ broad spectrum ABx (Vancomycin + Cefepime) for presumed acute bacterial healthcare acquired pneumonia (HCAP). Pulse oximetry, O2. Nebs, steroids (IV, transition to PO taper), incentive spirometry + pulmonary toileting as tolerated. 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 -Hypotension: Low BP after intubation, sedation w/ Propofol. Levophed to maintain hemodynamic  stability. Presently having difficulty obtaining accurate BP, largest size cuff does not fit on pt. Will likely benefit from invasive BP monitoring (arterial line). -AMS/obtundation: Likely 2/2 hypoxemia. CT head (-) acute intracranial abnl. -Hyperkalemia: K+ 7.2 on admission labwork. Unclear if specimen hemolyzed, repeat pending. IVF, nebs, insulin. -Hypochloremia: Pt likely w/ intravascular volume depletion. s/p 3L IVF in ED. -Hyperglycemia/T2NIDDM: SSI. Hold PO antihyperglycemics. -AKI: Cr 1.99 on present admission, increased from 1.2 in 11/2017. (+) AKI. Renal U/S, urine electrolytes + creatinine + urea + protein pending. Received IVF in ED. Monitor BMP, avoid nephrotoxins. -Hypocalcemia: Ionized calcium. -Hypoalbuminemia: Prealbumin. -Troponin elevation: Likely 2/2 hypoxemia, stress, demand. Trend. [id number]. Cardiac monitoring. -Normocytic anemia: Hgb 11.4, stable. Likely anemia of chronic disease. No evidence of acute blood loss at present time. -Access: Difficult access. Will likely need a central line. -Holding home meds. -FEN/GI: NPO. -DVT PPx: Discussed at length with pharmacy. Body habitus likely to impede subcutaneous absorption of heparin. AKI/ARF, (-) Lovenox. Poor access, anti-Xa potentially unreliable, indication for AC gtt weak. Ultimately decided on Arixtra 5mg  qD, taking into consideration body weight and renal function. -Code status: Full code. -Disposition: Admission, > 2 midnights.  All the records are reviewed and case discussed with ED provider. Management plans discussed with the patient, family and they are in agreement.  CODE STATUS: Full code.  TOTAL TIME TAKING CARE OF THIS PATIENT: 110 minutes.    Barbaraann Rondo M.D on 03/16/2018 at 4:23 AM  Between 7am to 6pm - Pager - [phone]  After 6pm go to [email or web address] - Social research officer, government  [facility]  Office  [phone]  CC: Primary care physician; System, Pcp Not In   Note:  This dictation was prepared with Dragon dictation along with smaller phrase technology. Any transcriptional errors that result from this process are unintentional.

## 2018-03-16 NOTE — Progress Notes (Signed)
Pharmacy Electrolyte Monitoring Consult:  Pharmacy consulted to assist in monitoring and replacing electrolytes in this 47 y.o. male admitted on 03/16/2018 with Respiratory Distress   Labs:  Sodium (mmol/L)  Date Value  03/16/2018 139   Potassium (mmol/L)  Date Value  03/16/2018 4.2   Magnesium (mg/dL)  Date Value  16/03/9603 2.2   Calcium (mg/dL)  Date Value  54/02/8118 8.3 (L)   Albumin (g/dL)  Date Value  14/78/2956 3.0 (L)    Assessment/Plan:  No replacement warranted at this time. Will recheck all electrolytes with am labs.   Pharmacy will continue to monitor and adjust per consult.   Simpson,Michael L 03/16/2018 5:19 PM

## 2018-03-17 ENCOUNTER — Inpatient Hospital Stay: Payer: Medicare Other

## 2018-03-17 DIAGNOSIS — I248 Other forms of acute ischemic heart disease: Secondary | ICD-10-CM

## 2018-03-17 LAB — CBC
HEMATOCRIT: 30.5 % — AB (ref 40.0–52.0)
Hemoglobin: 10.1 g/dL — ABNORMAL LOW (ref 13.0–18.0)
MCH: 27.7 pg (ref 26.0–34.0)
MCHC: 33 g/dL (ref 32.0–36.0)
MCV: 83.9 fL (ref 80.0–100.0)
Platelets: 301 10*3/uL (ref 150–440)
RBC: 3.63 MIL/uL — ABNORMAL LOW (ref 4.40–5.90)
RDW: 14.7 % — AB (ref 11.5–14.5)
WBC: 17.6 10*3/uL — AB (ref 3.8–10.6)

## 2018-03-17 LAB — FIBRIN DERIVATIVES D-DIMER (ARMC ONLY): Fibrin derivatives D-dimer (ARMC): 1694.45 ng/mL (FEU) — ABNORMAL HIGH (ref 0.00–499.00)

## 2018-03-17 LAB — GLUCOSE, CAPILLARY
GLUCOSE-CAPILLARY: 92 mg/dL (ref 70–99)
GLUCOSE-CAPILLARY: 113 mg/dL — AB (ref 70–99)
GLUCOSE-CAPILLARY: 157 mg/dL — AB (ref 70–99)
GLUCOSE-CAPILLARY: 68 mg/dL — AB (ref 70–99)
GLUCOSE-CAPILLARY: 87 mg/dL (ref 70–99)
Glucose-Capillary: 28 mg/dL — CL (ref 70–99)
Glucose-Capillary: 81 mg/dL (ref 70–99)
Glucose-Capillary: 96 mg/dL (ref 70–99)
Glucose-Capillary: 68 mg/dL — ABNORMAL LOW (ref 70–99)
Glucose-Capillary: 74 mg/dL (ref 70–99)

## 2018-03-17 LAB — CALCIUM, IONIZED: CALCIUM, IONIZED, SERUM: 4.5 mg/dL (ref 4.5–5.6)

## 2018-03-17 LAB — BASIC METABOLIC PANEL
Anion gap: 7 (ref 5–15)
BUN: 32 mg/dL — AB (ref 6–20)
CHLORIDE: 100 mmol/L (ref 98–111)
CO2: 34 mmol/L — AB (ref 22–32)
Calcium: 8.5 mg/dL — ABNORMAL LOW (ref 8.9–10.3)
Creatinine, Ser: 1.24 mg/dL (ref 0.61–1.24)
GFR calc Af Amer: >60 mL/min (ref >60)
GFR calc non Af Amer: >60 mL/min (ref >60)
Glucose, Bld: 141 mg/dL — ABNORMAL HIGH (ref 70–99)
POTASSIUM: 3.8 mmol/L (ref 3.5–5.1)
SODIUM: 141 mmol/L (ref 135–145)

## 2018-03-17 LAB — MAGNESIUM: Magnesium: 2.1 mg/dL (ref 1.7–2.4)

## 2018-03-17 LAB — PROCALCITONIN: PROCALCITONIN: 0.2 ng/mL

## 2018-03-17 LAB — UREA NITROGEN, URINE: UREA NITROGEN UR: 452 mg/dL

## 2018-03-17 MED ORDER — OCUVITE-LUTEIN PO CAPS
1.0000 | ORAL_CAPSULE | Freq: Every day | ORAL | Status: DC
  Administered 2018-03-17 – 2018-03-20 (×4): 1 via ORAL
  Filled 2018-03-17 (×4): qty 1

## 2018-03-17 MED ORDER — SODIUM CHLORIDE 0.9% FLUSH: 10.0000 mL | INTRAVENOUS | Status: DC | PRN

## 2018-03-17 MED ORDER — HYDRALAZINE HCL 20 MG/ML IJ SOLN: 10.0000 mg | Freq: Four times a day (QID) | INTRAMUSCULAR | Status: DC | PRN

## 2018-03-17 MED ORDER — SODIUM CHLORIDE 0.9% FLUSH
10.0000 mL | Freq: Two times a day (BID) | INTRAVENOUS | Status: DC
  Administered 2018-03-17 – 2018-03-20 (×7): 10 mL

## 2018-03-17 MED ORDER — FUROSEMIDE 10 MG/ML IJ SOLN
20.0000 mg | Freq: Once | INTRAMUSCULAR | Status: AC
  Administered 2018-03-17: 20 mg via INTRAVENOUS
  Filled 2018-03-17: qty 2

## 2018-03-17 NOTE — Care Management Note (Signed)
Case Management Note  Patient Details  Name: Edgar Taylor MRN: [id number] Date of Birth: Mar 15, 1971  Subjective/Objective:                 Patient is a long term care resident from [facility]  Was extubated this morning.    Action/Plan:   Expected Discharge Date:                  Expected Discharge Plan:     In-House Referral:     Discharge planning Services     Post Acute Care Choice:    Choice offered to:     DME Arranged:    DME Agency:     HH Arranged:    HH Agency:     Status of Service:     If discussed at [employer] of Stay Meetings, dates discussed:    Additional Comments:  Eber Hong, RN 03/17/2018, 11:39 AM

## 2018-03-17 NOTE — Progress Notes (Signed)
NP called to patient bedside to assess elevated peak pressures. Rhochi Breath sounds, no secretions during suctioning. Patient was lavaged and bagged by NP, deep suctioned to remove mucus plugs and copious amounts of thick secretions. Patient returned to Vent on previous settings. SAT 96% on 40% FIO2. Peak pressures have come back down. Patient resting comfortably in bed. Will continue to monitor.

## 2018-03-17 NOTE — Progress Notes (Signed)
Progress Note  Patient Name: Edgar Taylor Date of Encounter: 03/17/2018  Primary Cardiologist: Nelva Bush, MD   Subjective   Patient improved from yesterday with eyes open and somewhat responsive to verbal commands. Patient was intubated at the time of this exam but has since been successfully extubated per documentation and on [place].   Telemetry significant for sinus tachycardia yesterday with HR 100-130s and today HR 60s-100s. O2 sats also labile with recent dip from SpO2 95% to 56% on [place].  Weight increase since yesterday from 473 lbs  506 lbs with lasix x1 per critical care. Volume status difficult to assess d/t body habitus. Ddimer positive with recommendations as below in A/P and per IM.  Inpatient Medications    Scheduled Meds: . aspirin  81 mg Per Tube Daily  . atorvastatin  80 mg Per Tube q1800  . chlorhexidine gluconate (MEDLINE KIT)  15 mL Mouth Rinse BID  . enoxaparin (LOVENOX) injection  40 mg Subcutaneous Q12H  . famotidine  20 mg Per Tube BID  . insulin aspart  0-20 Units Subcutaneous Q4H  . ipratropium-albuterol  3 mL Nebulization Q4H  . mouth rinse  15 mL Mouth Rinse 10 times per day  . methylPREDNISolone (SOLU-MEDROL) injection  20 mg Intravenous Q12H  . multivitamin-lutein  1 capsule Oral Daily  . mupirocin ointment   Nasal BID  . sodium chloride flush  10-40 mL Intracatheter Q12H   Continuous Infusions: . ceFEPime (MAXIPIME) IV 200 mL/hr at 03/17/18 0600  . dextrose 50 mL/hr at 03/17/18 1004  . vancomycin 1,750 mg (03/17/18 0913)   PRN Meds: acetaminophen, bisacodyl, dextrose, midazolam, ondansetron (ZOFRAN) IV, sennosides, sodium chloride flush   Vital Signs    Vitals:   03/17/18 0300 03/17/18 0400 03/17/18 0500 03/17/18 0700  BP: 135/83 136/77  (!) 163/97  Pulse: 72 76  64  Resp: (!) 22 (!) 9  12  Temp:  99.2 F (37.3 C)  98.4 F (36.9 C)  TempSrc:  Axillary  Oral  SpO2: 97% 95%  95%  Weight:   (!) 229.5 kg   Height:         Intake/Output Summary (Last 24 hours) at 03/17/2018 1149 Last data filed at 03/17/2018 0900 Gross per 24 hour  Intake 2826.84 ml  Output 1785 ml  Net 1041.84 ml   Filed Weights   03/16/18 0026 03/16/18 0340 03/17/18 0500  Weight: (!) 215 kg (!) 222.3 kg (!) 229.5 kg    Telemetry    Sinus tachycardia yesterday 9/30 with HR 100-130s. Today HR 60-100s - Personally Reviewed  Physical Exam   GEN: No acute distress. Intubated at time of exam. No longer sedated. Somewhat responsive to verbal commands at time of exam. Neck: JVD difficult to assess d/t body habitus  Cardiac: distant heart sounds. RRR, no murmurs, rubs, or gallops.  Respiratory: Clear to auscultation bilaterally. GI: Obese, nontender MS: No edema; No deformity. Mild b/l edema Neuro:  Nonfocal  Psych: Normal affect   Labs    Chemistry Recent Labs  Lab 03/16/18 0034 03/16/18 0343 03/17/18 0527  NA 138 139 141  K 7.2* 4.2 3.8  CL 87* 94* 100  CO2 36* 34* 34*  GLUCOSE 110* 128* 141*  BUN 23* 25* 32*  CREATININE 1.99* 1.77* 1.24  CALCIUM 8.7* 8.3* 8.5*  PROT 9.2* 7.9  --   ALBUMIN 3.4* 3.0*  --   AST 32 30  --   ALT 16 21  --   ALKPHOS 82 69  --  BILITOT 1.1 1.0  --   GFRNONAA 39* 45* >60  GFRAA 45* 52* >60  ANIONGAP _0 Hematology Recent Labs  Lab 03/16/18 0034 03/16/18 0421 03/17/18 0527  WBC 18.5* 18.5* 17.6*  RBC 4.16* 3.74* 3.63*  HGB 11.4* 10.2* 10.1*  HCT 35.5* 31.5* 30.5*  MCV 85.2 84.3 83.9  MCH 27.5 27.2 27.7  MCHC 32.2 32.3 33.0  RDW 14.5 14.5 14.7*  PLT 354 300 301    Cardiac Enzymes Recent Labs  Lab 03/16/18 0034 03/16/18 0419 03/16/18 0617  TROPONINI 0.72* 1.02* 1.12*   No results for input(s): TROPIPOC in the last 168 hours.   BNPNo results for input(s): BNP, PROBNP in the last 168 hours.   DDimer No results for input(s): DDIMER in the last 168 hours.   Radiology    Dg Abd 1 View  Result Date: 03/16/2018 CLINICAL DATA:  NG tube placement. EXAM:  ABDOMEN - 1 VIEW COMPARISON:  None. FINDINGS: Tip and side port of the enteric tube below the diaphragm in the stomach. No bowel dilatation to suggest obstruction. Body habitus limits more detailed evaluation. IMPRESSION: Tip and side port of the enteric tube below the diaphragm in the stomach. Electronically Signed   By: Keith Rake M.D.   On: 03/16/2018 06:33   Ct Head Wo Contrast  Result Date: 03/16/2018 CLINICAL DATA:  Altered mental status today. EXAM: CT HEAD WITHOUT CONTRAST TECHNIQUE: Contiguous axial images were obtained from the base of the skull through the vertex without intravenous contrast. COMPARISON:  CT orbits November 26, 2017 FINDINGS: Large body habitus results in noisy image quality. BRAIN: No intraparenchymal hemorrhage, mass effect nor midline shift. The ventricles and sulci are normal for age. Patchy supratentorial white matter hypodensities within normal range for patient's age, though non-specific are most compatible with chronic small vessel ischemic disease. No acute large vascular territory infarcts. No abnormal extra-axial fluid collections. Basal cisterns are patent. VASCULAR: Moderate calcific atherosclerosis of the carotid siphons. SKULL: No skull fracture. No significant scalp soft tissue swelling. SINUSES/ORBITS: Lobulated paranasal sinus mucosal thickening. Mastoid air cells are well aerated. Soft tissue effacing LEFT external auditory canal, most compatible with cerumen.Proptosis from increased orbital fat. Decreased size of RIGHT medial epicanthal lesion, now measuring less than 1 cm. OTHER: None. IMPRESSION: 1. Negative noncontrast CT HEAD: for age. 2. Decreased size of right epicanthal lesion. Electronically Signed   By: Elon Alas M.D.   On: 03/16/2018 02:03   US Renal  Result Date: 03/16/2018 CLINICAL DATA:  Acute kidney injury EXAM: RENAL / URINARY TRACT ULTRASOUND COMPLETE COMPARISON:  None. FINDINGS: Right Kidney: Length: 10.0 cm. Echogenicity is within  normal limits. No mass or hydronephrosis visualized. Left Kidney: Length: 9.3 cm. Echogenicity within normal limits. No mass or hydronephrosis visualized. Bladder: Decompressed with Foley catheter in place, not well visualized. IMPRESSION: No acute findings. Electronically Signed   By: Rolm Baptise M.D.   On: 03/16/2018 09:44   Portable Chest Xray  Result Date: 03/17/2018 CLINICAL DATA:  Respiratory failure. EXAM: PORTABLE CHEST 1 VIEW COMPARISON:  Radiographs yesterday. FINDINGS: The endotracheal tube, enteric tube, and right central line remain in place. Unchanged cardiomegaly. Improving left upper lobe opacity from prior exam. Increasing patchy opacity in the right lung base. No large pleural effusion. No pneumothorax. IMPRESSION: 1. Improving left upper lobe opacity may be resolving pneumonia or pulmonary edema. 2. Worsening right basilar aeration may be atelectasis, aspiration or developing pneumonia. 3. Support apparatus are unchanged. Electronically Signed  By: Keith Rake M.D.   On: 03/17/2018 03:56   Dg Chest Port 1 View  Result Date: 03/16/2018 CLINICAL DATA:  Central line placement. EXAM: PORTABLE CHEST 1 VIEW COMPARISON:  Radiograph earlier this day. FINDINGS: Tip of the right internal jugular central venous catheter in the region in the proximal SVC/brachiocephalic confluence. No pneumothorax. Endotracheal tube tip at the thoracic inlet 5.4 cm from the carina. Enteric tube in place not well visualized distal to the lower mediastinum due to soft tissue attenuation from habitus. Unchanged cardiomegaly. Airspace opacity in the periphery of the left mid upper lung zone unchanged from prior exam. No large pleural effusion. IMPRESSION: 1. Tip of the right central line in the region of the proximal SVC/brachiocephalic confluence. No pneumothorax. 2. Again seen cardiomegaly and left upper lobe opacity suspicious for pneumonia. Electronically Signed   By: Keith Rake M.D.   On: 03/16/2018  06:32   Dg Chest Portable 1 View  Result Date: 03/16/2018 CLINICAL DATA:  ETT EXAM: PORTABLE CHEST 1 VIEW COMPARISON:  05/29/2017 FINDINGS: Endotracheal tube terminates 4 cm above the carina. Left upper lobe opacity, suspicious for pneumonia, less likely asymmetric interstitial edema. No definite pleural effusions. No pneumothorax. Cardiomegaly. IMPRESSION: Endotracheal tube terminates 4 cm above the carina. Left upper lobe opacity, suspicious for pneumonia. Electronically Signed   By: Julian Hy M.D.   On: 03/16/2018 01:34    Cardiac Studies   03/16/18  TTE Procedure narrative: Transthoracic echocardiography. Image   quality was suboptimal. The study was technically difficult, as a   result of poor acoustic windows, poor sound wave transmission,   restricted patient mobility, and body habitus. - Left ventricle: The cavity size was normal. There was mild   concentric hypertrophy. Systolic function was normal. The   estimated ejection fraction was in the range of 55% to 60%.   Images were inadequate for LV wall motion assessment. Left   ventricular diastolic function parameters were normal. - Left atrium: The atrium was mildly dilated. - Right ventricle: The cavity size was dilated. Wall thickness was   normal. - Pulmonary arteries: Systolic pressure could not be accurately   estimated.   Patient Profile     47 y.o. male with a hx of HTN, DM2, morbid obesity, asthma, and acute on chronic respiratory failure who is being seen today for the evaluation of elevated troponin at the request of Maura Crandall NP.  Assessment & Plan    Elevated troponin, likely demand ischemia in the setting of illness / sepsis - Likely troponin elevation in setting of illness and sinus tachycardia and due to demand ischemia  - Troponin 0.72  1.02  1.12; EKG sinus tachycardia without STE.   - Echo as above with RV dilation and EF 55-60%, poor image quality - Continue ASA 64m. Continue added  atorvastatin 835mdaily. Continue anticoagulation with adjustment for weight with fondaparinux. Previously unable to start on ? blocker with soft BP, now resolved. Recommend starting lopressor with continued elevated HR and BP - Daily weights show increase from 473.99 lb on 9/30   506 lbs 10/01. Received lasix today x1, difficult to assess fluid status d/t body habitus. - Continue to monitor renal function and electrolytes with BMP. Renal function improved since admission with Cr 1.99  1.24 (baseline ~1.15-1.2).  Hypoxia and Acute on Chronic Respiratory distress and asthma - [facility] [place] [facility]. Previously intubated. Now on [place] with llabile SpO2 and most recently 56% on [place].  - EKG  at presentation significant for Sinus tachycardia; HR continues to be labile with rates 60s-100s. - Ddimer [id number]. Given hypoxia, sinus tachycardia, and ddimer, discussed further PE workup at length and limitations in progressing further due to patient weight. As noted in previous documentation, could do lower extremity venous ultrasound. Could also consider changing to heparin v lovenox.  - Further recommendations and management per critical care, pulmonary medicine.  HTN - BP 160/127. HR in and out of sinus tachycardia - Consider start  blocker as directly above for HR and BP control, titrate as HR and BP allow - Received 1 dose IV lasix today per crit care.  Volume status difficult to obtain d/t body habitus. - Continue to monitor renal function in setting of diuresis and elevated BP. - Continue to monitor vitals  DM2 - SSI - Per IM   For questions or updates, please contact [facility] Please consult [email or web address] for contact info under        Signed, Arvil Chaco, PA-C  03/17/2018, 11:49 AM

## 2018-03-17 NOTE — Progress Notes (Signed)
Patient extubated this morning, tolerated well.  Appears to have significant sleep apnea.  Dr. Bard Herbert consulted and placed on BiPap when sleeping.

## 2018-03-17 NOTE — Progress Notes (Signed)
Pharmacy Electrolyte Monitoring Consult:  Pharmacy consulted to assist in monitoring and replacing electrolytes in this 47 y.o. male admitted on 03/16/2018 with Respiratory Distress   Labs:  Sodium (mmol/L)  Date Value  03/17/2018 141   Potassium (mmol/L)  Date Value  03/17/2018 3.8   Magnesium (mg/dL)  Date Value  16/03/9603 2.1   Calcium (mg/dL)  Date Value  54/02/8118 8.5 (L)   Albumin (g/dL)  Date Value  14/78/2956 3.0 (L)    Assessment/Plan:  No replacement warranted at this time. Will check BMP with am labs.   Pharmacy will continue to monitor and adjust per consult.   Pricilla Riffle, PharmD Pharmacy Resident  03/17/2018 2:40 PM

## 2018-03-17 NOTE — Progress Notes (Signed)
1645 Blood Sugar was 68, treated with 25mL

## 2018-03-17 NOTE — Progress Notes (Addendum)
Pharmacy Antibiotic Note  Edgar Taylor is a 47 y.o. male admitted on 03/16/2018 with acute on chronic respiratory failure. Patient admitted via EMS from [facility]. Patient with past medical history significant for morbid obesity, asthma, diabetes, and hypertension. Pharmacy has been consulted for cefepime and vancomycin dosing. MRSA PCR positive.   Plan: Per CCM on ICU rounds 10/1 am, will plan for 8 days of antibiotic therapy. Will follow up on rounds tomorrow for possible de-escalation.  Continue vancomycin 1750 mg IV Q12hr. Goal trough 15-20. Trough ordered 10/2 at 0730.  Continue cefepime 2 g IV Q8hr   Height: 6' (182.9 cm) Weight: (!) 506 lb (229.5 kg) IBW/kg (Calculated) : 77.6  ABW: 135kg  Temp (24hrs), Avg:99.2 F (37.3 C), Min:98.3 F (36.8 C), Max:100.5 F (38.1 C)  Recent Labs  Lab 03/16/18 0034 03/16/18 0343 03/16/18 0411 03/16/18 0421 03/17/18 0527  WBC 18.5*  --   --  18.5* 17.6*  CREATININE 1.99* 1.77*  --   --  1.24  LATICACIDVEN 2.6*  --  1.4  --   --     Estimated Creatinine Clearance: 147.3 mL/min (by C-G formula based on SCr of 1.24 mg/dL).    No Known Allergies  Antimicrobials this admission: Vancomycin 9/30 >>  Cefepime 9/30 >>   Dose adjustments this admission: 9/30 Vancomycin and cefepime adjusted   Microbiology results: 9/30 BCx: no growth 1 day 9/30 Respiratory Cx: no organisms 9/30 MRSA PCR: positive   Thank you for allowing pharmacy to be a part of this patient's care.  Pricilla Riffle, PharmD Pharmacy Resident  03/17/2018 2:34 PM

## 2018-03-17 NOTE — Progress Notes (Addendum)
CRITICAL CARE NOTE  CC  follow up respiratory failure  SUBJECTIVE Successfully exuibated this AM Alert and awake Following commands +productive cough    SIGNIFICANT EVENTS 10/1 extubated   BP (!) 163/97   Pulse 64   Temp 98.4 F (36.9 C) (Oral)   Resp 12   Ht 6' (1.829 m)   Wt (!) 229.5 kg   SpO2 95%   BMI 68.63 kg/m    REVIEW OF SYSTEMS  Gen:  Denies  fever, sweats, chills weigh loss  HEENT: Denies blurred vision, double vision, ear pain, eye pain, hearing loss, nose bleeds, sore throat Cardiac:  No dizziness, chest pain or heaviness, chest tightness,edema, No JVD Resp:   +cough +sputum production, +shortness of breath,+wheezing, -hemoptysis,  Gi: Denies swallowing difficulty, stomach pain, nausea or vomiting, diarrhea, constipation, bowel incontinence Other:  All other systems negative      Indwelling Urinary Catheter continued, requirement due to   Reason to continue Indwelling Urinary Catheter for strict Intake/Output monitoring for hemodynamic instability   Central Line continued, requirement due to   Reason to continue [employer] Monitoring of central venous pressure or other hemodynamic parameters     ASSESSMENT AND PLAN SYNOPSIS 47 year old African-American male presenting with acute on chronic respiratory failure secondary to pneumonia   Severe Hypoxic and Hypercapnic Respiratory Failure Extubated this AM   CARDIAC FAILURE-dCHF -oxygen as needed -Lasix as tolerated   CARDIAC SD monitoring  ID-HCAP -continue IV abx as prescibed -follow up cultures  GI/Nutrition GI PROPHYLAXIS as indicated DIET-->swallow eval pending Constipation protocol as indicated   ELECTROLYTES -follow labs as needed -replace as needed -pharmacy consultation and following   DVT/GI PRX ordered TRANSFUSIONS AS NEEDED MONITOR FSBS ASSESS the need for LABS as needed  PT/OT  Lucie Leather, M.D.  [facility]   Medical Director [facility] Medical Director [facility] Cardio-Pulmonary Department

## 2018-03-17 NOTE — Progress Notes (Signed)
1704 Blood Sugar rechecked and was 157.  Will continue to monitor patient.

## 2018-03-17 NOTE — Progress Notes (Signed)
Per NP request SBT initiated. PS 10, PEEP 5, 40%. RN and NP present for SBT. Patient off of sedation. Patient was able to pull adequate volumes with coaching. Without coaching patient was only pulling around 250-350 tidal volumes. Patient desat to low 70s. Patient switched back to previous settings, PRVC 600 25X 40% 5+.  Patient resting comfortably.

## 2018-03-17 NOTE — Progress Notes (Signed)
[facility]   PATIENT NAME: Edgar Taylor    MR#:  [id number]  DATE OF BIRTH:  Oct 25, 1970  SUBJECTIVE:  CHIEF COMPLAINT:   Chief Complaint  Patient presents with  . Respiratory Distress   Morbidly obese, sent from the facility because of respiratory distress.  Under treatment for pneumonia currently, intubated and in ICU care. Troponin also noted to be slightly high.   Extubated and comfortable.   REVIEW OF SYSTEMS:    Review of Systems  Constitutional: Positive for malaise/fatigue. Negative for fever.  HENT: Positive for sore throat. Negative for congestion and ear discharge.   Eyes: Negative for blurred vision, double vision and photophobia.  Respiratory: Negative for cough, sputum production and shortness of breath.   Cardiovascular: Negative for chest pain, palpitations and claudication.  Gastrointestinal: Negative for abdominal pain, nausea and vomiting.  Genitourinary: Negative for dysuria, frequency and urgency.  Musculoskeletal: Negative for back pain and myalgias.  Skin: Negative for rash.  Neurological: Negative for dizziness, tremors, sensory change, focal weakness and weakness.  Psychiatric/Behavioral: Negative for depression, hallucinations and memory loss.    DRUG ALLERGIES:  No Known Allergies  VITALS:  Blood pressure (!) 152/97, pulse (!) 110, temperature 99.1 F (37.3 C), temperature source Oral, resp. rate (!) 23, height 6' (1.829 m), weight (!) 229.5 kg, SpO2 91 %.  PHYSICAL EXAMINATION:  GENERAL:  47 y.o.-year-old patient lying in the bed critical appearing. EYES: Pupils equal, round, reactive to light . No scleral icterus. Extraocular muscles intact.  HEENT: Head atraumatic, normocephalic. Oropharynx and nasopharynx clear.  ET tube in place. NECK:  Supple, no jugular venous distention. No thyroid enlargement, no tenderness.  LUNGS: Bilateral air entry with ventilatory support and some crepitation.   CARDIOVASCULAR: S1, S2 normal. No murmurs, rubs, or gallops.  ABDOMEN: Soft, nontender, nondistended. Bowel sounds present. No organomegaly or mass.  EXTREMITIES: Bilateral some chronic pedal edema, no cyanosis, or clubbing.  NEUROLOGIC: Patient is sedated on ventilator support. PSYCHIATRIC: The patient is sedated.  SKIN: No obvious rash, lesion, or ulcer.   Physical Exam LABORATORY PANEL:   CBC Recent Labs  Lab 03/17/18 0527  WBC 17.6*  HGB 10.1*  HCT 30.5*  PLT 301   ------------------------------------------------------------------------------------------------------------------  Chemistries  Recent Labs  Lab 03/16/18 0343 03/17/18 0527  NA 139 141  K 4.2 3.8  CL 94* 100  CO2 34* 34*  GLUCOSE 128* 141*  BUN 25* 32*  CREATININE 1.77* 1.24  CALCIUM 8.3* 8.5*  MG 2.2 2.1  AST 30  --   ALT 21  --   ALKPHOS 69  --   BILITOT 1.0  --    ------------------------------------------------------------------------------------------------------------------  Cardiac Enzymes Recent Labs  Lab 03/16/18 0419 03/16/18 0617  TROPONINI 1.02* 1.12*   ------------------------------------------------------------------------------------------------------------------  RADIOLOGY:  Dg Abd 1 View  Result Date: 03/16/2018 CLINICAL DATA:  NG tube placement. EXAM: ABDOMEN - 1 VIEW COMPARISON:  None. FINDINGS: Tip and side port of the enteric tube below the diaphragm in the stomach. No bowel dilatation to suggest obstruction. Body habitus limits more detailed evaluation. IMPRESSION: Tip and side port of the enteric tube below the diaphragm in the stomach. Electronically Signed   By: Narda Rutherford M.D.   On: 03/16/2018 06:33   Ct Head Wo Contrast  Result Date: 03/16/2018 CLINICAL DATA:  Altered mental status today. EXAM: CT HEAD WITHOUT CONTRAST TECHNIQUE: Contiguous axial images were obtained from the base of the skull through the vertex without intravenous contrast. COMPARISON:  CT  orbits November 26, 2017 FINDINGS: Large body habitus results in noisy image quality. BRAIN: No intraparenchymal hemorrhage, mass effect nor midline shift. The ventricles and sulci are normal for age. Patchy supratentorial white matter hypodensities within normal range for patient's age, though non-specific are most compatible with chronic small vessel ischemic disease. No acute large vascular territory infarcts. No abnormal extra-axial fluid collections. Basal cisterns are patent. VASCULAR: Moderate calcific atherosclerosis of the carotid siphons. SKULL: No skull fracture. No significant scalp soft tissue swelling. SINUSES/ORBITS: Lobulated paranasal sinus mucosal thickening. Mastoid air cells are well aerated. Soft tissue effacing LEFT external auditory canal, most compatible with cerumen.Proptosis from increased orbital fat. Decreased size of RIGHT medial epicanthal lesion, now measuring less than 1 cm. OTHER: None. IMPRESSION: 1. Negative noncontrast CT HEAD: for age. 2. Decreased size of right epicanthal lesion. Electronically Signed   By: Awilda Metro M.D.   On: 03/16/2018 02:03   US Renal  Result Date: 03/16/2018 CLINICAL DATA:  Acute kidney injury EXAM: RENAL / URINARY TRACT ULTRASOUND COMPLETE COMPARISON:  None. FINDINGS: Right Kidney: Length: 10.0 cm. Echogenicity is within normal limits. No mass or hydronephrosis visualized. Left Kidney: Length: 9.3 cm. Echogenicity within normal limits. No mass or hydronephrosis visualized. Bladder: Decompressed with Foley catheter in place, not well visualized. IMPRESSION: No acute findings. Electronically Signed   By: Charlett Nose M.D.   On: 03/16/2018 09:44   Portable Chest Xray  Result Date: 03/17/2018 CLINICAL DATA:  Respiratory failure. EXAM: PORTABLE CHEST 1 VIEW COMPARISON:  Radiographs yesterday. FINDINGS: The endotracheal tube, enteric tube, and right central line remain in place. Unchanged cardiomegaly. Improving left upper lobe opacity from prior  exam. Increasing patchy opacity in the right lung base. No large pleural effusion. No pneumothorax. IMPRESSION: 1. Improving left upper lobe opacity may be resolving pneumonia or pulmonary edema. 2. Worsening right basilar aeration may be atelectasis, aspiration or developing pneumonia. 3. Support apparatus are unchanged. Electronically Signed   By: Narda Rutherford M.D.   On: 03/17/2018 03:56   Dg Chest Port 1 View  Result Date: 03/16/2018 CLINICAL DATA:  Central line placement. EXAM: PORTABLE CHEST 1 VIEW COMPARISON:  Radiograph earlier this day. FINDINGS: Tip of the right internal jugular central venous catheter in the region in the proximal SVC/brachiocephalic confluence. No pneumothorax. Endotracheal tube tip at the thoracic inlet 5.4 cm from the carina. Enteric tube in place not well visualized distal to the lower mediastinum due to soft tissue attenuation from habitus. Unchanged cardiomegaly. Airspace opacity in the periphery of the left mid upper lung zone unchanged from prior exam. No large pleural effusion. IMPRESSION: 1. Tip of the right central line in the region of the proximal SVC/brachiocephalic confluence. No pneumothorax. 2. Again seen cardiomegaly and left upper lobe opacity suspicious for pneumonia. Electronically Signed   By: Narda Rutherford M.D.   On: 03/16/2018 06:32   Dg Chest Portable 1 View  Result Date: 03/16/2018 CLINICAL DATA:  ETT EXAM: PORTABLE CHEST 1 VIEW COMPARISON:  05/29/2017 FINDINGS: Endotracheal tube terminates 4 cm above the carina. Left upper lobe opacity, suspicious for pneumonia, less likely asymmetric interstitial edema. No definite pleural effusions. No pneumothorax. Cardiomegaly. IMPRESSION: Endotracheal tube terminates 4 cm above the carina. Left upper lobe opacity, suspicious for pneumonia. Electronically Signed   By: Charline Bills M.D.   On: 03/16/2018 01:34    ASSESSMENT AND PLAN:   Active Problems:   Acute on chronic respiratory failure with  hypoxemia (HCC)   Septic shock (HCC)  Non-ST elevation (NSTEMI) myocardial infarction [facility])   Demand ischemia (HCC)   *Sepsis  Healthcare associated pneumonia  Cultures are sent, started on broad-spectrum antibiotics.  Managed per ICU team.  *Acute on chronic respiratory failure  Currently intubated on ventilator support  Management per critical care team.  Extubated.  *Elevated troponin  Echocardiogram shows elevated right-sided pressure.  Appreciated held by cardiologist  Suggested to continue the current medications and try to rule out pulmonary    Emboli.  pt is not hypoxic.  *Acute renal failure Likely secondary to sepsis, continue to monitor.  *Hyperkalemia Albuterol nebs and insulin were given and on repeat potassium level came normal, may be hemolyzed sample.  *History of COPD and presented with respiratory failure Continue steroid and nebulizer therapy.  *Morbid obesity Obesity hypoventilation and pickwickian syndrome He had tracheostomy, removed few months ago.   All the records are reviewed and case discussed with Care Management/Social Workerr. Management plans discussed with the patient, family and they are in agreement.  CODE STATUS: Full.  TOTAL TIME TAKING CARE OF THIS PATIENT: 35 minutes.    POSSIBLE D/C IN 1-2 DAYS, DEPENDING ON CLINICAL CONDITION.   Altamese Dilling M.D on 03/17/2018   Between 7am to 6pm - Pager - [phone]  After 6pm go to [email or web address] - password [email or web address]  [facility]  Office  [phone]  CC: Primary care physician; Charlott Rakes, MD  Note: This dictation was prepared with Dragon dictation along with smaller phrase technology. Any transcriptional errors that result from this process are unintentional.

## 2018-03-18 LAB — BASIC METABOLIC PANEL
ANION GAP: 9 (ref 5–15)
BUN: 22 mg/dL — ABNORMAL HIGH (ref 6–20)
CHLORIDE: 100 mmol/L (ref 98–111)
CO2: 33 mmol/L — ABNORMAL HIGH (ref 22–32)
Calcium: 8.7 mg/dL — ABNORMAL LOW (ref 8.9–10.3)
Creatinine, Ser: 1 mg/dL (ref 0.61–1.24)
GFR calc Af Amer: >60 mL/min (ref >60)
GLUCOSE: 112 mg/dL — AB (ref 70–99)
POTASSIUM: 4.3 mmol/L (ref 3.5–5.1)
SODIUM: 142 mmol/L (ref 135–145)

## 2018-03-18 LAB — GLUCOSE, CAPILLARY
GLUCOSE-CAPILLARY: 84 mg/dL (ref 70–99)
GLUCOSE-CAPILLARY: 80 mg/dL (ref 70–99)
GLUCOSE-CAPILLARY: 75 mg/dL (ref 70–99)
Glucose-Capillary: 91 mg/dL (ref 70–99)
Glucose-Capillary: 94 mg/dL (ref 70–99)
Glucose-Capillary: 69 mg/dL — ABNORMAL LOW (ref 70–99)

## 2018-03-18 LAB — MAGNESIUM: Magnesium: 2.4 mg/dL (ref 1.7–2.4)

## 2018-03-18 LAB — CBC
HEMATOCRIT: 31 % — AB (ref 40.0–52.0)
HEMOGLOBIN: 10.1 g/dL — AB (ref 13.0–18.0)
MCH: 27.9 pg (ref 26.0–34.0)
MCHC: 32.5 g/dL (ref 32.0–36.0)
MCV: 85.9 fL (ref 80.0–100.0)
Platelets: 305 10*3/uL (ref 150–440)
RBC: 3.61 MIL/uL — AB (ref 4.40–5.90)
RDW: 14.6 % — ABNORMAL HIGH (ref 11.5–14.5)
WBC: 18.2 10*3/uL — ABNORMAL HIGH (ref 3.8–10.6)

## 2018-03-18 LAB — VANCOMYCIN, TROUGH: VANCOMYCIN TR: 19 ug/mL (ref 15–20)

## 2018-03-18 LAB — TROPONIN I: Troponin I: 0.18 ng/mL (ref <0.03)

## 2018-03-18 LAB — PROCALCITONIN: Procalcitonin: 0.14 ng/mL

## 2018-03-18 MED ORDER — FAMOTIDINE 20 MG PO TABS
20.0000 mg | ORAL_TABLET | Freq: Two times a day (BID) | ORAL | Status: DC
  Administered 2018-03-18 – 2018-03-20 (×4): 20 mg via ORAL
  Filled 2018-03-18 (×4): qty 1

## 2018-03-18 MED ORDER — ASPIRIN 81 MG PO CHEW: 81.0000 mg | CHEWABLE_TABLET | Freq: Every day | ORAL | Status: DC

## 2018-03-18 MED ORDER — ADULT MULTIVITAMIN W/MINERALS CH
1.0000 | ORAL_TABLET | Freq: Every day | ORAL | Status: DC
  Administered 2018-03-19: 1 via ORAL
  Filled 2018-03-18 (×2): qty 1

## 2018-03-18 MED ORDER — ASPIRIN EC 81 MG PO TBEC
81.0000 mg | DELAYED_RELEASE_TABLET | Freq: Every day | ORAL | Status: DC
  Administered 2018-03-19 – 2018-03-20 (×2): 81 mg via ORAL
  Filled 2018-03-18 (×2): qty 1

## 2018-03-18 MED ORDER — ENSURE ENLIVE PO LIQD
237.0000 mL | Freq: Four times a day (QID) | ORAL | Status: DC
  Administered 2018-03-19 – 2018-03-20 (×5): 237 mL via ORAL

## 2018-03-18 MED ORDER — ATORVASTATIN CALCIUM 20 MG PO TABS
80.0000 mg | ORAL_TABLET | Freq: Every day | ORAL | Status: DC
  Administered 2018-03-18 – 2018-03-19 (×2): 80 mg via ORAL
  Filled 2018-03-18 (×2): qty 4

## 2018-03-18 NOTE — Progress Notes (Signed)
CRITICAL CARE NOTE  CC  follow up respiratory failure  SUBJECTIVE NAD No SOB No CP Productive cough Eating Needs PT   SIGNIFICANT EVENTS 10/1 extubated   BP 126/60   Pulse 91   Temp 98.5 F (36.9 C) (Oral)   Resp 19   Ht 6' (1.829 m)   Wt (!) 227.7 kg   SpO2 100%   BMI 68.08 kg/m     Review of Systems:  Gen:  Denies  fever, sweats, chills weigh loss  HEENT: Denies blurred vision, double vision, ear pain, eye pain, hearing loss, nose bleeds, sore throat Cardiac:  No dizziness, chest pain or heaviness, chest tightness,edema, No JVD Resp:   +cough +sputum production, +shortness of breath,+wheezing, -hemoptysis,  Other:  All other systems negative   Physical Examination:   GENERAL:NAD, no fevers, chills, no weakness no fatigue HEAD: Normocephalic, atraumatic.  EYES: Pupils equal, round, reactive to light. Extraocular muscles intact. No scleral icterus.  MOUTH: Moist mucosal membrane. Dentition intact. No abscess noted.  EAR, NOSE, THROAT: Clear without exudates. No external lesions.  NECK: Supple. No thyromegaly. No nodules. No JVD.  PULMONARY: CTA B/L no wheezing, rhonchi, crackles CARDIOVASCULAR: S1 and S2. Regular rate and rhythm. No murmurs, rubs, or gallops. No edema. Pedal pulses 2+ bilaterally.  GASTROINTESTINAL: Soft, nontender, nondistended. No masses. Positive bowel sounds. No hepatosplenomegaly.  MUSCULOSKELETAL: No swelling, clubbing, or edema. Range of motion full in all extremities.  NEUROLOGIC: Cranial nerves II through XII are intact. No gross focal neurological deficits. Sensation intact. Reflexes intact.  SKIN: No ulceration, lesions, rashes, or cyanosis. Skin warm and dry. Turgor intact.  PSYCHIATRIC: Mood, affect within normal limits. The patient is awake, alert and oriented x 3. Insight, judgment intact.  ALL OTHER ROS ARE NEGATIVE       ASSESSMENT AND PLAN SYNOPSIS  47 year old African-American male presenting with acute on chronic  respiratory failure secondary to pneumonia   Resp failure-resolved  Continue therapy for pneumonia HCAP  dCHF Lasix as tolerated  Transfer to gen med floor  Diara Chaudhari Santiago Glad, M.D.  [facility]  Medical Director [facility] Medical Director [facility] Cardio-Pulmonary Department

## 2018-03-18 NOTE — Progress Notes (Signed)
Nutrition Follow Up Note   DOCUMENTATION CODES:   Morbid obesity  INTERVENTION:   Ensure Enlive po QID, each supplement provides 350 kcal and 20 grams of protein  MVI daily   NUTRITION DIAGNOSIS:   Inadequate oral intake related to inability to eat as evidenced by NPO status. -pt initiated on clear liquids  GOAL:   Patient will meet greater than or equal to 90% of their needs  MONITOR:   PO intake, Supplement acceptance, Labs, Weight trends, Skin, I & O's, Diet advancement  ASSESSMENT:   47 year old male with PMHx of asthma, HTN, DM, hx tracheostomy in 2018 s/p decannulation who presented from [facility] after being found unresponsive and severely hypoxic, subsequently intubated on 9/30 found to have right upper lobe PNA.   Pt extubated yesterday; tolerating well. Pt advanced to clear liquid diet today. RD will add Ensure once diet advanced as pt with increased estimated needs r/t morbid obesity. Per chart, pt with weight gain since admit but trending down now.   Medications reviewed and include: aspirin, lovenox, pepcid, insulin, solu-medrol, ocuvite, cefepime, 5% dextrose 11ml/hr  Labs reviewed: BUN 22(H) WBC- 18.2(H), Hgb 10.1(L), Hct 31.0(L)  Diet Order:   Diet Order            Diet clear liquid Room service appropriate? Yes; Fluid consistency: Thin  Diet effective now             EDUCATION NEEDS:   No education needs have been identified at this time  Skin:  Skin Assessment: Reviewed RN Assessment  Last BM:  03/16/2018  Height:   Ht Readings from Last 1 Encounters:  03/16/18 6' (1.829 m)    Weight:   Wt Readings from Last 1 Encounters:  03/18/18 (!) 227.7 kg    Ideal Body Weight:  80.9 kg  BMI:  Body mass index is 68.08 kg/m.  Estimated Nutritional Needs:   Kcal:  3200-3500kcal/day   Protein:  >182g/day   Fluid:  >2.4L/day   Betsey Holiday MS, RD, LDN Pager #- [phone] Office#- [phone] After Hours Pager:  [phone]

## 2018-03-18 NOTE — Progress Notes (Signed)
[employer] - [place] [facility]   PATIENT NAME: Edgar Taylor    MR#:  [id number]  DATE OF BIRTH:  08-01-1970  SUBJECTIVE:  CHIEF COMPLAINT:   Chief Complaint  Patient presents with  . Respiratory Distress   Morbidly obese, sent from the facility because of respiratory distress.  Under treatment for pneumonia currently, intubated and in ICU care. Troponin also noted to be slightly high.   Extubated and comfortable.   REVIEW OF SYSTEMS:    Review of Systems  Constitutional: Positive for malaise/fatigue. Negative for fever.  HENT: Positive for sore throat. Negative for congestion and ear discharge.   Eyes: Negative for blurred vision, double vision and photophobia.  Respiratory: Negative for cough, sputum production and shortness of breath.   Cardiovascular: Negative for chest pain, palpitations and claudication.  Gastrointestinal: Negative for abdominal pain, nausea and vomiting.  Genitourinary: Negative for dysuria, frequency and urgency.  Musculoskeletal: Negative for back pain and myalgias.  Skin: Negative for rash.  Neurological: Negative for dizziness, tremors, sensory change, focal weakness and weakness.  Psychiatric/Behavioral: Negative for depression, hallucinations and memory loss.    DRUG ALLERGIES:  No Known Allergies  VITALS:  Blood pressure (!) 146/79, pulse 91, temperature 98.2 F (36.8 C), temperature source Oral, resp. rate (!) 24, height 6' (1.829 m), weight (!) 227.7 kg, SpO2 99 %.  PHYSICAL EXAMINATION:  GENERAL:  47 y.o.-year-old patient lying in the bed critical appearing. EYES: Pupils equal, round, reactive to light . No scleral icterus. Extraocular muscles intact.  HEENT: Head atraumatic, normocephalic. Oropharynx and nasopharynx clear.  ET tube in place. NECK:  Supple, no jugular venous distention. No thyroid enlargement, no tenderness.  LUNGS: Bilateral air entry with ventilatory support and some crepitation.  CARDIOVASCULAR:  S1, S2 normal. No murmurs, rubs, or gallops.  ABDOMEN: Soft, nontender, nondistended. Bowel sounds present. No organomegaly or mass.  EXTREMITIES: Bilateral some chronic pedal edema, no cyanosis, or clubbing.  NEUROLOGIC: Patient is sedated on ventilator support. PSYCHIATRIC: The patient is sedated.  SKIN: No obvious rash, lesion, or ulcer.   Physical Exam LABORATORY PANEL:   CBC Recent Labs  Lab 03/18/18 0521  WBC 18.2*  HGB 10.1*  HCT 31.0*  PLT 305   ------------------------------------------------------------------------------------------------------------------  Chemistries  Recent Labs  Lab 03/16/18 0343  03/18/18 0521  NA 139   < > 142  K 4.2   < > 4.3  CL 94*   < > 100  CO2 34*   < > 33*  GLUCOSE 128*   < > 112*  BUN 25*   < > 22*  CREATININE 1.77*   < > 1.00  CALCIUM 8.3*   < > 8.7*  MG 2.2   < > 2.4  AST 30  --   --   ALT 21  --   --   ALKPHOS 69  --   --   BILITOT 1.0  --   --    < > = values in this interval not displayed.   ------------------------------------------------------------------------------------------------------------------  Cardiac Enzymes Recent Labs  Lab 03/16/18 0617 03/18/18 0849  TROPONINI 1.12* 0.18*   ------------------------------------------------------------------------------------------------------------------  RADIOLOGY:  Portable Chest Xray  Result Date: 03/17/2018 CLINICAL DATA:  Respiratory failure. EXAM: PORTABLE CHEST 1 VIEW COMPARISON:  Radiographs yesterday. FINDINGS: The endotracheal tube, enteric tube, and right central line remain in place. Unchanged cardiomegaly. Improving left upper lobe opacity from prior exam. Increasing patchy opacity in the right lung base. No large pleural effusion. No pneumothorax. IMPRESSION: 1. Improving  left upper lobe opacity may be resolving pneumonia or pulmonary edema. 2. Worsening right basilar aeration may be atelectasis, aspiration or developing pneumonia. 3. Support apparatus  are unchanged. Electronically Signed   By: Narda Rutherford M.D.   On: 03/17/2018 03:56    ASSESSMENT AND PLAN:   Active Problems:   Acute on chronic respiratory failure with hypoxemia (HCC)   Septic shock (HCC)   Non-ST elevation (NSTEMI) myocardial infarction [facility])   Demand ischemia (HCC)   *Sepsis  Healthcare associated pneumonia  Cultures are sent, started on broad-spectrum antibiotics.  Managed per ICU team.  *Acute on chronic respiratory failure  Currently intubated on ventilator support  Management per critical care team.  Extubated.  *Elevated troponin  Echocardiogram shows elevated right-sided pressure.  Appreciated held by cardiologist  Suggested to continue the current medications and try to rule out pulmonary    Emboli.  pt is not hypoxic.  *Acute renal failure Likely secondary to sepsis, continue to monitor.  *Hyperkalemia Albuterol nebs and insulin were given and on repeat potassium level came normal, may be hemolyzed sample.  *History of COPD and presented with respiratory failure Continue steroid and nebulizer therapy.  *Morbid obesity Obesity hypoventilation and pickwickian syndrome He had tracheostomy, removed few months ago.  I will call the social work consult and physical therapy evaluation as he would need to go back to the skilled nursing facility.  All the records are reviewed and case discussed with Care Management/Social Workerr. Management plans discussed with the patient, family and they are in agreement.  CODE STATUS: Full.  TOTAL TIME TAKING CARE OF THIS PATIENT: 35 minutes.    POSSIBLE D/C IN 1-2 DAYS, DEPENDING ON CLINICAL CONDITION.   Altamese Dilling M.D on 03/18/2018   Between 7am to 6pm - Pager - [phone]  After 6pm go to [email or web address] - password [email or web address]  [facility]  Office  [phone]  CC: Primary care physician; Charlott Rakes, MD  Note: This dictation was prepared with Dragon  dictation along with smaller phrase technology. Any transcriptional errors that result from this process are unintentional.

## 2018-03-18 NOTE — Progress Notes (Signed)
Pt sitting up in bed eating CL diet. Pt denies pain or discomfort. No acute distress noted.

## 2018-03-18 NOTE — Progress Notes (Signed)
 Progress Note  Patient Name: Edgar Taylor Date of Encounter: 03/18/2018  Primary Cardiologist: Christopher End, MD   Subjective   [name] today and appears much better today.  Weight increase since yesterday from 473 lbs  506 lbs  502 lbs. Volume status difficult to assess d/t body habitus. Ddimer positive with recommendations as below in A/P and per IM.  Repeat troponin today - troponin trending down: 0.72  1.12  0.18   Patient reports minimal ambulation leading up to admission, at which time he resided in a nursing home. No cardiac complaints at this time. Denies chest pain, palpitations, feeling of racing heart rate.  Inpatient Medications    Scheduled Meds: . [START ON 03/19/2018] aspirin  81 mg Oral Daily  . atorvastatin  80 mg Oral q1800  . chlorhexidine gluconate (MEDLINE KIT)  15 mL Mouth Rinse BID  . enoxaparin (LOVENOX) injection  40 mg Subcutaneous Q12H  . famotidine  20 mg Oral BID  . [START ON 03/19/2018] feeding supplement (ENSURE ENLIVE)  237 mL Oral QID  . insulin aspart  0-20 Units Subcutaneous Q4H  . ipratropium-albuterol  3 mL Nebulization Q4H  . mouth rinse  15 mL Mouth Rinse 10 times per day  . methylPREDNISolone (SOLU-MEDROL) injection  20 mg Intravenous Q12H  . [START ON 03/19/2018] multivitamin with minerals  1 tablet Oral Daily  . multivitamin-lutein  1 capsule Oral Daily  . mupirocin ointment   Nasal BID  . sodium chloride flush  10-40 mL Intracatheter Q12H   Continuous Infusions: . ceFEPime (MAXIPIME) IV 2 g (03/18/18 0517)  . dextrose 50 mL/hr at 03/18/18 0609   PRN Meds: acetaminophen, bisacodyl, dextrose, hydrALAZINE, midazolam, ondansetron (ZOFRAN) IV, sennosides, sodium chloride flush   Vital Signs    Vitals:   03/18/18 0422 03/18/18 0516 03/18/18 0730 03/18/18 0900  BP: 135/90  126/60 (!) 142/72  Pulse: 91  91 88  Resp: (!) 21  19 (!) 24  Temp:    98.2 F (36.8 C)  TempSrc:    Oral  SpO2: 91%  100% 98%  Weight:  (!) 227.7  kg    Height:        Intake/Output Summary (Last 24 hours) at 03/18/2018 1256 Last data filed at 03/18/2018 0609 Gross per 24 hour  Intake 2937.7 ml  Output 2400 ml  Net 537.7 ml   Filed Weights   03/16/18 0340 03/17/18 0500 03/18/18 0516  Weight: (!) 222.3 kg (!) 229.5 kg (!) 227.7 kg    Telemetry    SR, HR 80s-90s.  - Personally Reviewed  Physical Exam   GEN: No acute distress.Obese.  Neck: JVD difficult to assess d/t body habitus  Cardiac: RRR, no murmurs, rubs, or gallops.  Respiratory: Clear to auscultation bilaterally but difficult to assess as patient unable to sit upright on his own GI: Obese, nontender MS: No edema; No deformity.  Neuro:  Nonfocal  Psych: Normal affect   Labs    Chemistry Recent Labs  Lab 03/16/18 0034 03/16/18 0343 03/17/18 0527 03/18/18 0521  NA 138 139 141 142  K 7.2* 4.2 3.8 4.3  CL 87* 94* 100 100  CO2 36* 34* 34* 33*  GLUCOSE 110* 128* 141* 112*  BUN 23* 25* 32* 22*  CREATININE 1.99* 1.77* 1.24 1.00  CALCIUM 8.7* 8.3* 8.5* 8.7*  PROT 9.2* 7.9  --   --   ALBUMIN 3.4* 3.0*  --   --   AST 32 30  --   --     ALT 16 21  --   --   ALKPHOS 82 69  --   --   BILITOT 1.1 1.0  --   --   GFRNONAA 39* 45* >60 >60  GFRAA 45* 52* >60 >60  ANIONGAP _0 Hematology Recent Labs  Lab 03/16/18 0421 03/17/18 0527 03/18/18 0521  WBC 18.5* 17.6* 18.2*  RBC 3.74* 3.63* 3.61*  HGB 10.2* 10.1* 10.1*  HCT 31.5* 30.5* 31.0*  MCV 84.3 83.9 85.9  MCH 27.2 27.7 27.9  MCHC 32.3 33.0 32.5  RDW 14.5 14.7* 14.6*  PLT 300 301 305    Cardiac Enzymes Recent Labs  Lab 03/16/18 0034 03/16/18 0419 03/16/18 0617 03/18/18 0849  TROPONINI 0.72* 1.02* 1.12* 0.18*   No results for input(s): TROPIPOC in the last 168 hours.   BNPNo results for input(s): BNP, PROBNP in the last 168 hours.   DDimer No results for input(s): DDIMER in the last 168 hours.   Radiology    Portable Chest Xray  Result Date: 03/17/2018 CLINICAL DATA:   Respiratory failure. EXAM: PORTABLE CHEST 1 VIEW COMPARISON:  Radiographs yesterday. FINDINGS: The endotracheal tube, enteric tube, and right central line remain in place. Unchanged cardiomegaly. Improving left upper lobe opacity from prior exam. Increasing patchy opacity in the right lung base. No large pleural effusion. No pneumothorax. IMPRESSION: 1. Improving left upper lobe opacity may be resolving pneumonia or pulmonary edema. 2. Worsening right basilar aeration may be atelectasis, aspiration or developing pneumonia. 3. Support apparatus are unchanged. Electronically Signed   By: Keith Rake M.D.   On: 03/17/2018 03:56    Cardiac Studies   03/16/18  TTE Procedure narrative: Transthoracic echocardiography. Image   quality was suboptimal. The study was technically difficult, as a   result of poor acoustic windows, poor sound wave transmission,   restricted patient mobility, and body habitus. - Left ventricle: The cavity size was normal. There was mild   concentric hypertrophy. Systolic function was normal. The   estimated ejection fraction was in the range of 55% to 60%.   Images were inadequate for LV wall motion assessment. Left   ventricular diastolic function parameters were normal. - Left atrium: The atrium was mildly dilated. - Right ventricle: The cavity size was dilated. Wall thickness was   normal. - Pulmonary arteries: Systolic pressure could not be accurately   estimated.   Patient Profile     47 y.o. male with a hx of HTN, DM2, morbid obesity, asthma, and acute on chronic respiratory failure who is being seen today for the evaluation of elevated troponin at the request of Maura Crandall NP.  Assessment & Plan    Elevated troponin, likely demand ischemia in the setting of illness / sepsis - No current chest pain - Likely troponin elevation in setting of illness and sinus tachycardia and due to demand ischemia  - Troponin 0.72  1.02  1.12  0.18; Initial EKG  showed sinus tachycardia without STE.   - Echo as above with RV dilation and EF 55-60%, poor image quality - Continue ASA 22m. Continue added atorvastatin 837mdaily. Previously unable to start on ? blocker with soft BP, now resolved. Recommend lopressor if continued elevated HR and BP - Continue to monitor renal function and electrolytes with BMP. Renal function improved since admission with Cr 1.99  1.24  1.00 (baseline ~1.1-1.2). - Cardiology will sign off with recommendation for outpatient follow-up  Hypoxia and Acute on Chronic Respiratory distress and asthma -  Presented to [facility] ED with [place] in 80s. Previously intubated.  - EKG as above. - Ddimer 1694.456. Given hypoxia, sinus tachycardia, and ddimer, discussed further PE workup at length and limitations in progressing further due to patient weight. As noted in previous documentation, could do lower extremity venous ultrasound.  - Further recommendations and management per critical care, pulmonary medicine.  HTN - BP 142/72. HR 80s-90s - Consider start  blocker as directly above for HR and BP control if needed  - Continue to monitor vitals  DM2 - SSI - Per IM  For questions or updates, please contact [facility] Please consult [email or web address] for contact info under        Signed, Jacquelyn D Visser, PA-C  03/18/2018, 12:56 PM     

## 2018-03-18 NOTE — Progress Notes (Signed)
Pharmacy Antibiotic Note  Edgar Taylor is a 47 y.o. male admitted on 03/16/2018 with acute on chronic respiratory failure. Patient admitted via EMS from [facility]. Patient with past medical history significant for morbid obesity, asthma, diabetes, and hypertension. Pharmacy has been consulted for cefepime and vancomycin dosing. MRSA PCR positive.   Plan: Per CCM on ICU rounds 10/2 am, will discontinue vancomycin and monitor patient on cefepime monotherapy. Plan is for 8 days of cefepime  Continue cefepime 2 g IV Q8hr   Height: 6' (182.9 cm) Weight: (!) 502 lb (227.7 kg) IBW/kg (Calculated) : 77.6  ABW: 135kg  Temp (24hrs), Avg:98.4 F (36.9 C), Min:97.8 F (36.6 C), Max:99.2 F (37.3 C)  Recent Labs  Lab 03/16/18 0034 03/16/18 0343 03/16/18 0411 03/16/18 0421 03/17/18 0527 03/18/18 0521 03/18/18 0849  WBC 18.5*  --   --  18.5* 17.6* 18.2*  --   CREATININE 1.99* 1.77*  --   --  1.24 1.00  --   LATICACIDVEN 2.6*  --  1.4  --   --   --   --   VANCOTROUGH  --   --   --   --   --   --  19    Estimated Creatinine Clearance: 181.6 mL/min (by C-G formula based on SCr of 1 mg/dL).    No Known Allergies  Antimicrobials this admission: Vancomycin 9/30 >> 10/2 Cefepime 9/30 >> 10/6  Dose adjustments this admission: 9/30 Vancomycin and cefepime adjusted   Microbiology results: 9/30 BCx: no growth 2 days 9/30 Respiratory Cx: no organisms 9/30 MRSA PCR: positive   Thank you for allowing pharmacy to be a part of this patient's care.  Pricilla Riffle, PharmD Pharmacy Resident  03/18/2018 2:09 PM

## 2018-03-18 NOTE — Progress Notes (Signed)
Pharmacy Electrolyte Monitoring Consult:  Pharmacy consulted to assist in monitoring and replacing electrolytes in this 47 y.o. male admitted on 03/16/2018 with Respiratory Distress   Labs:  Sodium (mmol/L)  Date Value  03/18/2018 142   Potassium (mmol/L)  Date Value  03/18/2018 4.3   Magnesium (mg/dL)  Date Value  91/47/8295 2.4   Calcium (mg/dL)  Date Value  62/13/0865 8.7 (L)   Albumin (g/dL)  Date Value  78/46/9629 3.0 (L)    Assessment/Plan:  No replacement warranted at this time. Pharmacy will not order electrolytes with am labs.   Pharmacy will continue to follow and adjust per consult.   Pricilla Riffle, PharmD Pharmacy Resident  03/18/2018 2:08 PM

## 2018-03-19 LAB — CBC
HEMATOCRIT: 31 % — AB (ref 40.0–52.0)
Hemoglobin: 10.1 g/dL — ABNORMAL LOW (ref 13.0–18.0)
MCH: 28.1 pg (ref 26.0–34.0)
MCHC: 32.6 g/dL (ref 32.0–36.0)
MCV: 86.1 fL (ref 80.0–100.0)
Platelets: 309 10*3/uL (ref 150–440)
RBC: 3.6 MIL/uL — ABNORMAL LOW (ref 4.40–5.90)
RDW: 14.7 % — AB (ref 11.5–14.5)
WBC: 14.7 10*3/uL — ABNORMAL HIGH (ref 3.8–10.6)

## 2018-03-19 LAB — BASIC METABOLIC PANEL
Anion gap: 3 — ABNORMAL LOW (ref 5–15)
BUN: 16 mg/dL (ref 6–20)
CALCIUM: 8.9 mg/dL (ref 8.9–10.3)
CO2: 37 mmol/L — ABNORMAL HIGH (ref 22–32)
CREATININE: 0.82 mg/dL (ref 0.61–1.24)
Chloride: 101 mmol/L (ref 98–111)
GFR calc non Af Amer: >60 mL/min (ref >60)
GLUCOSE: 106 mg/dL — AB (ref 70–99)
Potassium: 4.6 mmol/L (ref 3.5–5.1)
Sodium: 141 mmol/L (ref 135–145)

## 2018-03-19 LAB — GLUCOSE, CAPILLARY
Glucose-Capillary: 120 mg/dL — ABNORMAL HIGH (ref 70–99)
Glucose-Capillary: 130 mg/dL — ABNORMAL HIGH (ref 70–99)
Glucose-Capillary: 83 mg/dL (ref 70–99)

## 2018-03-19 LAB — STREP PNEUMONIAE URINARY ANTIGEN: Strep Pneumo Urinary Antigen: NEGATIVE — AB

## 2018-03-19 NOTE — Progress Notes (Signed)
Pharmacy Electrolyte Monitoring Consult:  Pharmacy consulted to assist in monitoring and replacing electrolytes in this 47 y.o. male admitted on 03/16/2018 with Respiratory Distress   Labs:  Sodium (mmol/L)  Date Value  03/19/2018 141   Potassium (mmol/L)  Date Value  03/19/2018 4.6   Magnesium (mg/dL)  Date Value  16/03/9603 2.4   Calcium (mg/dL)  Date Value  54/02/8118 8.9   Albumin (g/dL)  Date Value  14/78/2956 3.0 (L)    Assessment/Plan:  K 4.6   No replacement warranted at this time.   Pharmacy will continue to follow and adjust per consult.   Bari Mantis PharmD Clinical Pharmacist 03/19/2018

## 2018-03-19 NOTE — NC FL2 (Signed)
[place] MEDICAID FL2 LEVEL OF CARE SCREENING TOOL     IDENTIFICATION  Patient Name: Edgar Taylor Birthdate: 03-26-1971 Sex: male Admission Date (Current Location): 03/16/2018  [place] and [place] Number:  Chiropodist and Address:  [facility], [address] [place], [place] [address]      Provider Number: [id number]  Attending Physician Name and Address:  Altamese Dilling, *  Relative Name and Phone Number:       Current Level of Care: Hospital Recommended Level of Care: Skilled Nursing Facility Prior Approval Number:    Date Approved/Denied:   PASRR Number:    Discharge Plan: SNF    Current Diagnoses: Patient Active Problem List   Diagnosis Date Noted  . Demand ischemia (HCC)   . Acute on chronic respiratory failure with hypoxemia (HCC) 03/16/2018  . Septic shock (HCC)   . Non-ST elevation (NSTEMI) myocardial infarction (HCC)   . 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    Orientation RESPIRATION BLADDER Height & Weight     Self, Time, Place  O2(2 liters ) Indwelling catheter Weight: (!) 513 lb (232.7 kg) Height:  6' (182.9 cm)  BEHAVIORAL SYMPTOMS/MOOD NEUROLOGICAL BOWEL NUTRITION STATUS  (none) (none) Continent Diet(Soft Diet )  AMBULATORY STATUS COMMUNICATION OF NEEDS Skin   Extensive Assist Verbally Normal                       Personal Care Assistance Level of Assistance  Bathing, Feeding, Dressing Bathing Assistance: Limited assistance Feeding assistance: Independent Dressing Assistance: Limited assistance     Functional Limitations Info  Sight, Hearing, Speech Sight Info: Adequate Hearing Info: Adequate Speech Info: Adequate    SPECIAL CARE FACTORS FREQUENCY  PT (By licensed PT), OT (By licensed OT)     PT Frequency: 5 OT Frequency: 5            Contractures Contractures Info: Not present    Additional Factors Info  Code Status, Allergies Code Status Info: Full Code  Allergies  Info: NKA           Current Medications (03/19/2018):  This is the current hospital active medication list Current Facility-Administered Medications  Medication Dose Route Frequency Provider Last Rate Last Dose  . acetaminophen (TYLENOL) tablet 650 mg  650 mg Oral Q4H PRN Barbaraann Rondo, MD      . aspirin EC tablet 81 mg  81 mg Oral Daily Altamese Dilling, MD   81 mg at 03/19/18 0845  . atorvastatin (LIPITOR) tablet 80 mg  80 mg Oral q1800 Erin Fulling, MD   80 mg at 03/18/18 1709  . bisacodyl (DULCOLAX) suppository 10 mg  10 mg Rectal Daily PRN Tukov-Yual, Magdalene S, NP      . ceFEPIme (MAXIPIME) 2 g in sodium chloride 0.9 % 100 mL IVPB  2 g Intravenous Q8H Kasa, Kurian, MD 200 mL/hr at 03/19/18 1427 2 g at 03/19/18 1427  . dextrose 5 % solution   Intravenous Continuous Erin Fulling, MD 50 mL/hr at 03/19/18 1415    . dextrose 50 % solution 25 mL  25 mL Intravenous Q5 min PRN Erin Fulling, MD   25 mL at 03/17/18 1650  . enoxaparin (LOVENOX) injection 40 mg  40 mg Subcutaneous Q12H Erin Fulling, MD   40 mg at 03/19/18 0846  . famotidine (PEPCID) tablet 20 mg  20 mg Oral BID Erin Fulling, MD   20 mg at 03/19/18 0845  . feeding supplement (ENSURE ENLIVE) (ENSURE ENLIVE) liquid 237  mL  237 mL Oral QID Erin Fulling, MD   237 mL at 03/19/18 1410  . hydrALAZINE (APRESOLINE) injection 10 mg  10 mg Intravenous Q6H PRN Harlon Ditty D, NP      . ipratropium-albuterol (DUONEB) 0.5-2.5 (3) MG/3ML nebulizer solution 3 mL  3 mL Nebulization Q4H Barbaraann Rondo, MD   3 mL at 03/19/18 1513  . methylPREDNISolone sodium succinate (SOLU-MEDROL) 40 mg/mL injection 20 mg  20 mg Intravenous Q12H Erin Fulling, MD   20 mg at 03/19/18 0847  . midazolam (VERSED) injection 2-4 mg  2-4 mg Intravenous Q2H PRN Tukov-Yual, Magdalene S, NP      . multivitamin with minerals tablet 1 tablet  1 tablet Oral Daily Erin Fulling, MD   1 tablet at 03/19/18 1410  . multivitamin-lutein (OCUVITE-LUTEIN) capsule 1  capsule  1 capsule Oral Daily Erin Fulling, MD   1 capsule at 03/19/18 0846  . mupirocin ointment (BACTROBAN) 2 %   Nasal BID Erin Fulling, MD      . ondansetron [facility]) injection 4 mg  4 mg Intravenous Q6H PRN Barbaraann Rondo, MD      . sennosides (SENOKOT) 8.8 MG/5ML syrup 5 mL  5 mL Per Tube BID PRN Tukov-Yual, Magdalene S, NP      . sodium chloride flush (NS) 0.9 % injection 10-40 mL  10-40 mL Intracatheter Q12H Altamese Dilling, MD   10 mL at 03/19/18 0847  . sodium chloride flush (NS) 0.9 % injection 10-40 mL  10-40 mL Intracatheter PRN Altamese Dilling, MD         Discharge Medications: Please see discharge summary for a list of discharge medications.  Relevant Imaging Results:  Relevant Lab Results:   Additional Information    Edgar Taylor  [name], [address]

## 2018-03-19 NOTE — Clinical Social Work Note (Signed)
Clinical Social Work Assessment  Patient Details  Name: Edgar Taylor MRN: [id number] Date of Birth: [birth year]  Date of referral:  03/19/18               Reason for consult:  Facility Placement                Permission sought to share information with:  Case Manager, Customer service manager, Family Supports Permission granted to share information::  Yes, Verbal Permission Granted  Name::        Agency::     Relationship::     Contact Information:     Housing/Transportation Living arrangements for the past 2 months:  [place] of Information:  Patient Patient Interpreter Needed:  None Criminal Activity/Legal Involvement Pertinent to Current Situation/Hospitalization:  No - Comment as needed Significant Relationships:  Other Family Members Lives with:  Facility Resident Do you feel safe going back to the place where you live?  Yes Need for family participation in patient care:  No (Coment)  Care giving concerns:  Patient is a long term resident at [facility] / plan:  CSW consulted for facility placement. CSW met with patient to discuss discharge plan. Patient states that he is a long term resident at [facility] and plans to return there when able. Patient states that he has some family support but they are not able to help care for him. CSW spoke with Claiborne Billings at [facility] and she confirms that patient can return to [facility] when ready. CSW will follow for discharge planning.   Employment status:  Disabled (Comment on whether or not currently receiving Disability) Insurance information:  Managed Medicare PT Recommendations:  [place] / Referral to community resources:     Patient/Family's Response to care:  Patient thanked CSW for assistance   Patient/Family's Understanding of and Emotional Response to Diagnosis, Current Treatment, and Prognosis:  Patient understands current  diagnosis and is in agreement with discharge plan   Emotional Assessment Appearance:  Appears stated age Attitude/Demeanor/Rapport:    Affect (typically observed):  Accepting, Pleasant Orientation:  Oriented to Self, Oriented to Place, Oriented to  Time Alcohol / Substance use:  Not Applicable Psych involvement (Current and /or in the community):  No (Comment)  Discharge Needs  Concerns to be addressed:  Discharge Planning Concerns Readmission within the last 30 days:  No Current discharge risk:  None Barriers to Discharge:  Continued Medical Work up   [employer], [place] 03/19/2018, 3:37 PM

## 2018-03-19 NOTE — Care Management Important Message (Signed)
Important Message  Patient Details  Name: Edgar Taylor MRN: [id number] Date of Birth: 07-12-1970   Medicare Important Message Given:  Yes    Gwenette Greet, RN 03/19/2018, 1:44 PM

## 2018-03-19 NOTE — Progress Notes (Signed)
Physical Therapy Evaluation Patient Details Name: Edgar Taylor MRN: [id number] DOB: Mar 27, 1971 Today's Date: 03/19/2018   History of Present Illness  Edgar Taylor  is a 47 y.o. 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 Pt is already intubated at the time of my assessment. Hx/ROS unobtainable from pt. Information was obtained from ED provider/staff and documentation. He also used to have a tracheostomy, up until late (December) 2018, when he was admitted to [facility] for dislodged tracheostomy tube. It was decided at that time to not replace it. With regards to the present admission medical record indicates that staff at the healthcare facility were rounding, and found the pt poorly-responsive, w/ SpO2 in the 40% range. EMS was called. Pt found to be breathing agonally, obtunded/stuporous, intubated. CXR demonstrates pneumonia. WBC 18.5, Lactate 2.6. SIRS (+). Hypotensive 2/2 sedation, on Levophed. Admitted to ICU for acute on chronic hypoxemic respiratory failure, acute asthma exacerbation, SIRS, sepsis, HCAP. Pt has not been successfully extubated and transferred to the floor. Pt reports that he is a resident of [facility] and was receiving PT until approximately 1-3 months ago? Pt unclear why PT stopped but he believes it was due to insurance. Pt is very eager and motivated to be able to walk again.  Clinical Impression  Pt admitted with above diagnosis. Pt currently with functional limitations due to the deficits listed below (see PT Problem List).   Pt requires heavy cues for safe sequencing during bed mobility. MaxA+2 as well as additional time to come to sitting from supine. Once upright in sitting he is stable without external support. Once sitting SaO2 drops to 84% on 2L/min  supplemental O2 and does not recover. O2 increased to 4L/min and pt instructed in pursed lip breathing. After approximately 90 seconds SaO2 recovered to 92%. Pt is able to transfer without external assist or support. Bed is elevated to assist in transfer. Pt stabilizes in standing with UE support on bariatric rolling walker. He is able to remain standing for approximately 1 minute. SaO2 remains greater than 90% on 4L/min supplemental O2 via [place] however breathing is labored. Pt is able to take a few small lateral steps to the left to move up to the [facility] but is unable to safely ambulate at this time. He is able to perform supine exercises as instructed by therapist. Pt does have the potential to be able to ambulate however he would require regular physical therapy and aggressive weight loss. Pt is very eager to work regularly with therapy to progress toward that goal. Recommend SNF placement at discharge for physical therapy to return to his prior level of function. Pt will benefit from PT services to address deficits in strength, balance, and mobility in order to return to full function at home.      Follow Up Recommendations SNF    Equipment Recommendations  None recommended by PT;Other (comment)(Use walker for transfers and ambulation attempts)    Recommendations for Other Services       Precautions / Restrictions Precautions Precautions: Fall Restrictions Weight Bearing Restrictions: No      Mobility  Bed Mobility Overal bed mobility: Needs Assistance Bed Mobility: Supine to Sit     Supine to sit: Max assist;+2 for physical assistance     General bed mobility comments: Pt requires heavy cues for safe sequencing. MaxA+2 as  well as additional time to come to sitting. Once upright in sitting he is stable without external support. Once sitting SaO2 drops to 84% on 2L/min supplemental O2 and does not recover. O2 increased to 4L/min and pt instructed in pursed lip breathing. After approximately  90s SaO2 recovered to 92%  Transfers Overall transfer level: Needs assistance Equipment used: Rolling walker (2 wheeled)(Bariatric) Transfers: Sit to/from Stand Sit to Stand: Min guard;+2 safety/equipment         General transfer comment: Pt is able to transfer without external assist and support. Bed is elevated to assist in transfer. Pt stabilizes in standing with UE support on bariatric rolling walker. He is able to remain standing for approximately 1 minute. SaO2 remains greater than 90% on 4L/min supplemental O2 via  however breathing is labored. Pt is able to take a few small lateral steps to the left to move up to the [facility] but is unable to safely ambulate at this time.   Ambulation/Gait             General Gait Details: Unable  Stairs            Wheelchair Mobility    Modified Rankin (Stroke Patients Only)       Balance Overall balance assessment: Needs assistance Sitting-balance support: No upper extremity supported Sitting balance-Leahy Scale: Fair     Standing balance support: Bilateral upper extremity supported Standing balance-Leahy Scale: Poor Standing balance comment: Requires support on walker to remain in standing                             Pertinent Vitals/Pain Pain Assessment: 0-10 Pain Score: 4  Pain Location: R thigh, No known injury per patient Pain Descriptors / Indicators: Aching Pain Intervention(s): Monitored during session    Home Living Family/patient expects to be discharged to:: Skilled nursing facility                 Additional Comments: Pt is a resident at [facility] and plans to return at discharge    Prior Function Level of Independence: Needs assistance   Gait / Transfers Assistance Needed: Pt states that he was previously ambulatory with PT 1-3? months ago. He states that he used a single point cane. Since PT stopped pt reports he has been unable to get out of bed  ADL's / Homemaking  Assistance Needed: Pt requires assist for ADLs/IADLs        Hand Dominance        Extremity/Trunk Assessment   Upper Extremity Assessment Upper Extremity Assessment: Generalized weakness    Lower Extremity Assessment Lower Extremity Assessment: Generalized weakness       Communication   Communication: No difficulties  Cognition Arousal/Alertness: Awake/alert Behavior During Therapy: WFL for tasks assessed/performed Overall Cognitive Status: No family/caregiver present to determine baseline cognitive functioning                                 General Comments: Pt oriented to person and time. States that he is at [facility] but believes he is in [place]. He is unable to report that he is in [place] or [facility] Comments      Exercises General Exercises - Lower Extremity Ankle Circles/Pumps: Both;10 reps Heel Slides: Both;10 reps Hip ABduction/ADduction: Both;10 reps Straight Leg Raises: Both;10 reps   Assessment/Plan  PT Assessment Patient needs continued PT services  PT Problem List Decreased strength;Decreased balance;Cardiopulmonary status limiting activity       PT Treatment Interventions DME instruction;Gait training;Functional mobility training;Therapeutic activities;Therapeutic exercise;Balance training;Neuromuscular re-education;Patient/family education    PT Goals (Current goals can be found in the Care Plan section)  Acute Rehab PT Goals Patient Stated Goal: "I really want to be able to walk again" PT Goal Formulation: With patient Time For Goal Achievement: 04/02/18 Potential to Achieve Goals: Good    Frequency Min 2X/week   Barriers to discharge        Co-evaluation               AM-PAC PT "6 Clicks" Daily Activity  Outcome Measure Difficulty turning over in bed (including adjusting bedclothes, sheets and blankets)?: Unable Difficulty moving from lying on back to sitting on the side of the  bed? : Unable Difficulty sitting down on and standing up from a chair with arms (e.g., wheelchair, bedside commode, etc,.)?: A Lot Help needed moving to and from a bed to chair (including a wheelchair)?: Total Help needed walking in hospital room?: Total Help needed climbing 3-5 steps with a railing? : Total 6 Click Score: 7    End of Session   Activity Tolerance: Patient tolerated treatment well Patient left: in bed;with bed alarm set;with call bell/phone within reach Nurse Communication: Mobility status PT Visit Diagnosis: Unsteadiness on feet (R26.81);Muscle weakness (generalized) (M62.81);Difficulty in walking, not elsewhere classified (R26.2)    Time: 1030-1100 PT Time Calculation (min) (ACUTE ONLY): 30 min   Charges:   PT Evaluation $PT Eval Moderate Complexity: 1 Mod PT Treatments $Therapeutic Exercise: 8-22 mins        Sharalyn Ink Trejuan Matherne PT, DPT, GCS   Oryan Winterton 03/19/2018, 11:35 AM

## 2018-03-19 NOTE — Progress Notes (Signed)
[facility] [place] [facility]   PATIENT NAME: Edgar Taylor    MR#:  [id number]  DATE OF BIRTH:  December 05, 1970  SUBJECTIVE:  CHIEF COMPLAINT:   Chief Complaint  Patient presents with  . Respiratory Distress   Morbidly obese, sent from the facility because of respiratory distress.  Under treatment for pneumonia currently, intubated and in ICU care. Troponin also noted to be slightly high.   Extubated and comfortable.   Tolerating food now, upgraded now to soft diet.  REVIEW OF SYSTEMS:    Review of Systems  Constitutional: Positive for malaise/fatigue. Negative for fever.  HENT: Positive for sore throat. Negative for congestion and ear discharge.   Eyes: Negative for blurred vision, double vision and photophobia.  Respiratory: Negative for cough, sputum production and shortness of breath.   Cardiovascular: Negative for chest pain, palpitations and claudication.  Gastrointestinal: Negative for abdominal pain, nausea and vomiting.  Genitourinary: Negative for dysuria, frequency and urgency.  Musculoskeletal: Negative for back pain and myalgias.  Skin: Negative for rash.  Neurological: Negative for dizziness, tremors, sensory change, focal weakness and weakness.  Psychiatric/Behavioral: Negative for depression, hallucinations and memory loss.    DRUG ALLERGIES:  No Known Allergies  VITALS:  Blood pressure (!) 143/71, pulse 72, temperature 98.8 F (37.1 C), resp. rate (!) 22, height 6' (1.829 m), weight (!) 232.7 kg, SpO2 92 %.  PHYSICAL EXAMINATION:  GENERAL:  47 y.o.-year-old patient lying in the bed critical appearing. EYES: Pupils equal, round, reactive to light . No scleral icterus. Extraocular muscles intact.  HEENT: Head atraumatic, normocephalic. Oropharynx and nasopharynx clear.  ET tube in place. NECK:  Supple, no jugular venous distention. No thyroid enlargement, no tenderness.  LUNGS: Bilateral air entry with ventilatory support and some  crepitation.  CARDIOVASCULAR: S1, S2 normal. No murmurs, rubs, or gallops.  ABDOMEN: Soft, nontender, nondistended. Bowel sounds present. No organomegaly or mass.  EXTREMITIES: Bilateral some chronic pedal edema, no cyanosis, or clubbing.  NEUROLOGIC: Patient is alert, obese, moves limbs very little due to overweight. PSYCHIATRIC: The patient is alert and oriented.  SKIN: No obvious rash, lesion, or ulcer.   Physical Exam LABORATORY PANEL:   CBC Recent Labs  Lab 03/19/18 0557  WBC 14.7*  HGB 10.1*  HCT 31.0*  PLT 309   ------------------------------------------------------------------------------------------------------------------  Chemistries  Recent Labs  Lab 03/16/18 0343  03/18/18 0521 03/19/18 0557  NA 139   < > 142 141  K 4.2   < > 4.3 4.6  CL 94*   < > 100 101  CO2 34*   < > 33* 37*  GLUCOSE 128*   < > 112* 106*  BUN 25*   < > 22* 16  CREATININE 1.77*   < > 1.00 0.82  CALCIUM 8.3*   < > 8.7* 8.9  MG 2.2   < > 2.4  --   AST 30  --   --   --   ALT 21  --   --   --   ALKPHOS 69  --   --   --   BILITOT 1.0  --   --   --    < > = values in this interval not displayed.   ------------------------------------------------------------------------------------------------------------------  Cardiac Enzymes Recent Labs  Lab 03/16/18 0617 03/18/18 0849  TROPONINI 1.12* 0.18*   ------------------------------------------------------------------------------------------------------------------  RADIOLOGY:  No results found.  ASSESSMENT AND PLAN:   Active Problems:   Acute on chronic respiratory failure with hypoxemia (HCC)  Septic shock (HCC)   Non-ST elevation (NSTEMI) myocardial infarction [facility])   Demand ischemia (HCC)   *Sepsis  Healthcare associated pneumonia  Cultures are sent, started on broad-spectrum antibiotics.  Managed per ICU team.  *Acute on chronic respiratory failure  Currently intubated on ventilator support  Management per critical care  team.  Extubated.  *Elevated troponin  Echocardiogram shows elevated right-sided pressure.  Appreciated held by cardiologist  Suggested to continue the current medications and try to rule out pulmonary    Emboli.  pt is not hypoxic.  *Acute renal failure Likely secondary to sepsis, continue to monitor.  Normal now.  *Hyperkalemia Albuterol nebs and insulin were given and on repeat potassium level came normal, may be hemolyzed sample.  *History of COPD and presented with respiratory failure Continue steroid and nebulizer therapy.  * Elevated D dimer- Pt is comfortable on room air,    Will not do any further test for PE.    His elevated Right heart pressure could be due to obesity and COPD.  *Morbid obesity Obesity hypoventilation and pickwickian syndrome He had tracheostomy, removed few months ago.  I will call the social work consult and physical therapy evaluation as he would need to go back to the skilled nursing facility.  May d/c tomorrow. Will need CPAP at night.  All the records are reviewed and case discussed with Care Management/Social Workerr. Management plans discussed with the patient, family and they are in agreement.  CODE STATUS: Full.  TOTAL TIME TAKING CARE OF THIS PATIENT: 35 minutes.    POSSIBLE D/C IN 1-2 DAYS, DEPENDING ON CLINICAL CONDITION.   Altamese Dilling M.D on 03/19/2018   Between 7am to 6pm - Pager - [phone]  After 6pm go to [email or web address] - password [email or web address]  [facility]  Office  [phone]  CC: Primary care physician; Charlott Rakes, MD  Note: This dictation was prepared with Dragon dictation along with smaller phrase technology. Any transcriptional errors that result from this process are unintentional.

## 2018-03-20 LAB — GLUCOSE, CAPILLARY
GLUCOSE-CAPILLARY: 123 mg/dL — AB (ref 70–99)
GLUCOSE-CAPILLARY: 67 mg/dL — AB (ref 70–99)
GLUCOSE-CAPILLARY: 85 mg/dL (ref 70–99)

## 2018-03-20 LAB — HIV ANTIBODY (ROUTINE TESTING W REFLEX): HIV SCREEN 4TH GENERATION: NONREACTIVE

## 2018-03-20 MED ORDER — BISACODYL 10 MG RE SUPP: 10.0000 mg | Freq: Every day | RECTAL | 0 refills | Status: AC | PRN

## 2018-03-20 MED ORDER — ATORVASTATIN CALCIUM 80 MG PO TABS: 80.0000 mg | ORAL_TABLET | Freq: Every day | ORAL | 0 refills | Status: AC

## 2018-03-20 MED ORDER — ALBUTEROL SULFATE (2.5 MG/3ML) 0.083% IN NEBU: 2.5000 mg | INHALATION_SOLUTION | RESPIRATORY_TRACT | Status: DC | PRN

## 2018-03-20 MED ORDER — IPRATROPIUM-ALBUTEROL 0.5-2.5 (3) MG/3ML IN SOLN: 3.0000 mL | Freq: Three times a day (TID) | RESPIRATORY_TRACT | 0 refills | Status: AC

## 2018-03-20 MED ORDER — ASPIRIN 81 MG PO TBEC: 81.0000 mg | DELAYED_RELEASE_TABLET | Freq: Every day | ORAL | 0 refills | Status: AC

## 2018-03-20 MED ORDER — IPRATROPIUM-ALBUTEROL 0.5-2.5 (3) MG/3ML IN SOLN
3.0000 mL | Freq: Three times a day (TID) | RESPIRATORY_TRACT | Status: DC
  Administered 2018-03-20: 3 mL via RESPIRATORY_TRACT
  Filled 2018-03-20: qty 3

## 2018-03-20 MED ORDER — CEFPODOXIME PROXETIL 200 MG PO TABS: 200.0000 mg | ORAL_TABLET | Freq: Two times a day (BID) | ORAL | 0 refills | Status: AC

## 2018-03-20 MED ORDER — TORSEMIDE 20 MG PO TABS: 20.0000 mg | ORAL_TABLET | Freq: Every day | ORAL | 0 refills | Status: AC

## 2018-03-20 NOTE — Discharge Summary (Signed)
[facility] [place] at [facility]   PATIENT NAME: Edgar Taylor    MR#:  [id number]  DATE OF BIRTH:  1970-12-27  DATE OF ADMISSION:  03/16/2018 ADMITTING PHYSICIAN: Barbaraann Rondo, MD  DATE OF DISCHARGE: 03/20/2018   PRIMARY CARE PHYSICIAN: Charlott Rakes, MD    ADMISSION DIAGNOSIS:  [facility] - Unresponsive  DISCHARGE DIAGNOSIS:  Active Problems:   Acute on chronic respiratory failure with hypoxemia (HCC)   Septic shock (HCC)   Non-ST elevation (NSTEMI) myocardial infarction [facility])   Demand ischemia (HCC)   SECONDARY DIAGNOSIS:   Past Medical History:  Diagnosis Date  . Acute and chronic respiratory failure with hypercapnia (HCC)   . Asthma   . Diabetes mellitus without complication (HCC)   . Hypertension   . Hypokalemia   . Obesity     HOSPITAL COURSE:   *Sepsis  Healthcare associated pneumonia  Cultures are sent, started on broad-spectrum antibiotics.  Managed per ICU team.  *Acute on chronic respiratory failure  Currently intubated on ventilator support  Management per critical care team.  Extubated.  Need CPAP at night.  *Elevated troponin  Echocardiogram shows elevated right-sided pressure.  Appreciated held by cardiologist  Suggested to continue the current medications and try to rule out pulmonary    Emboli.  pt is not hypoxic.  *Acute renal failure Likely secondary to sepsis, continue to monitor.  Normal now.  *Hyperkalemia Albuterol nebs and insulin were given and on repeat potassium level came normal, may be hemolyzed sample.  *History of COPD and presented with respiratory failure Continue steroid and nebulizer therapy.  * Elevated D dimer- Pt is comfortable on room air,    Will not do any further test for PE.    His elevated Right heart pressure could be due to obesity and COPD.  *Morbid obesity Obesity hypoventilation and pickwickian syndrome He had tracheostomy, removed few months ago.  I  will call the social work consult and physical therapy evaluation as he would need to go back to the skilled nursing facility.  DISCHARGE CONDITIONS:   Stable.  CONSULTS OBTAINED:  Treatment Team:  Barbaraann Rondo, MD  DRUG ALLERGIES:  No Known Allergies  DISCHARGE MEDICATIONS:   Allergies as of 03/20/2018   No Known Allergies     Medication List    STOP taking these medications   pravastatin 20 MG tablet Commonly known as:  PRAVACHOL     TAKE these medications   acetaminophen 325 MG tablet Commonly known as:  TYLENOL Take 650 mg by mouth every 4 (four) hours as needed for mild pain or fever.   amLODipine 5 MG tablet Commonly known as:  NORVASC Take 5 mg by mouth daily.   aspirin 81 MG EC tablet Take 1 tablet (81 mg total) by mouth daily. Start taking on:  03/21/2018   atorvastatin 80 MG tablet Commonly known as:  LIPITOR Take 1 tablet (80 mg total) by mouth daily at 6 PM.   bisacodyl 10 MG suppository Commonly known as:  DULCOLAX Place 1 suppository (10 mg total) rectally daily as needed for moderate constipation.   cefpodoxime 200 MG tablet Commonly known as:  VANTIN Take 1 tablet (200 mg total) by mouth 2 (two) times daily for 4 days.   erythromycin ophthalmic ointment Place 1 application into the right eye daily.   famotidine 20 MG tablet Commonly known as:  PEPCID Take 20 mg by mouth at bedtime.   ipratropium-albuterol 0.5-2.5 (3) MG/3ML Soln Commonly known as:  DUONEB Take 3 mLs by nebulization 3 (three) times daily.   levalbuterol 1.25 MG/3ML nebulizer solution Commonly known as:  XOPENEX Take 1.25 mg by nebulization every 6 (six) hours as needed for wheezing.   multivitamin with minerals tablet Take 1 tablet by mouth daily.   mupirocin ointment 2 % Commonly known as:  BACTROBAN Apply to nose twice a day for 5 days   polyethylene glycol packet Commonly known as:  MIRALAX / GLYCOLAX Take 17 g by mouth daily.   Potassium Chloride ER  20 MEQ Tbcr Take 20 mEq by mouth daily.   potassium chloride SA 20 MEQ tablet Commonly known as:  K-DUR,KLOR-CON Take 20 mEq by mouth daily.   torsemide 20 MG tablet Commonly known as:  DEMADEX Take 1 tablet (20 mg total) by mouth daily. What changed:  how much to take        DISCHARGE INSTRUCTIONS:    Follow with PMD in 1 week,.  If you experience worsening of your admission symptoms, develop shortness of breath, life threatening emergency, suicidal or homicidal thoughts you must seek medical attention immediately by calling 911 or calling your MD immediately  if symptoms less severe.  You Must read complete instructions/literature along with all the possible adverse reactions/side effects for all the Medicines you take and that have been prescribed to you. Take any new Medicines after you have completely understood and accept all the possible adverse reactions/side effects.   Please note  You were cared for by a hospitalist during your hospital stay. If you have any questions about your discharge medications or the care you received while you were in the hospital after you are discharged, you can call the unit and asked to speak with the hospitalist on call if the hospitalist that took care of you is not available. Once you are discharged, your primary care physician will handle any further medical issues. Please note that NO REFILLS for any discharge medications will be authorized once you are discharged, as it is imperative that you return to your primary care physician (or establish a relationship with a primary care physician if you do not have one) for your aftercare needs so that they can reassess your need for medications and monitor your lab values.    Today   CHIEF COMPLAINT:   Chief Complaint  Patient presents with  . Respiratory Distress    HISTORY OF PRESENT ILLNESS:  Edgar Taylor  is a 47 y.o. male with a known history of super obesity (BMI > 50), T2NIDDM,  HTN, asthma, Hx chronic respiratory failure (w/ Hx Trach, removed last yr) p/w acute on chronic hypoxemic respiratory failure, AMS/obtundation necessitating intubation and mechanical ventilation. Pt is already intubated at the time of my assessment. Hx/ROS unobtainable from pt. Information is obtained from ED provider/staff and documentation. Pt is an absolute unit. I am in awe at the size of this lad. My understanding is that he resides at [facility] for this reason alone. This does not surprise me, as I would have difficulty expecting a man of his habitus to engage in effective personal hygiene and self-care without assistance. As I understand, he also used to have a tracheostomy, up until late (December) 2018, when he was admitted to [facility] for dislodged tracheostomy tube. It was decided at that time to not replace it.  With regards to the present admission: I am told that staff at the healthcare facility were rounding, and found the pt poorly-responsive, w/ SpO2 in the 40% range.  EMS was called. Pt found to be breathing agonally, obtunded/stuporous, intubated. CXR demonstrates pneumonia. WBC 18.5, Lactate 2.6. SIRS (+). Hypotensive 2/2 sedation, on Levophed. Admit to ICU for acute on chronic hypoxemic respiratory failure, acute asthma exacerbation, SIRS, sepsis, HCAP.   VITAL SIGNS:  Blood pressure (!) 156/86, pulse 84, temperature (!) 97.4 F (36.3 C), temperature source Axillary, resp. rate (!) 22, height 6' (1.829 m), weight (!) 234.5 kg, SpO2 91 %.  I/O:    Intake/Output Summary (Last 24 hours) at 03/20/2018 1022 Last data filed at 03/20/2018 0502 Gross per 24 hour  Intake 1231.43 ml  Output 2300 ml  Net -1068.57 ml    PHYSICAL EXAMINATION:   GENERAL:  47 y.o.-year-old patient lying in the bed critical appearing. EYES: Pupils equal, round, reactive to light . No scleral icterus. Extraocular muscles intact.  HEENT: Head atraumatic, normocephalic. Oropharynx and nasopharynx clear.   ET tube in place. NECK:  Supple, no jugular venous distention. No thyroid enlargement, no tenderness.  LUNGS: Bilateral air entry with ventilatory support and some crepitation.  CARDIOVASCULAR: S1, S2 normal. No murmurs, rubs, or gallops.  ABDOMEN: Soft, nontender, nondistended. Bowel sounds present. No organomegaly or mass.  EXTREMITIES: Bilateral some chronic pedal edema, no cyanosis, or clubbing.  NEUROLOGIC: Patient is alert, obese, moves limbs very little due to overweight. PSYCHIATRIC: The patient is alert and oriented.  SKIN: No obvious rash, lesion, or ulcer.   DATA REVIEW:   CBC Recent Labs  Lab 03/19/18 0557  WBC 14.7*  HGB 10.1*  HCT 31.0*  PLT 309    Chemistries  Recent Labs  Lab 03/16/18 0343  03/18/18 0521 03/19/18 0557  NA 139   < > 142 141  K 4.2   < > 4.3 4.6  CL 94*   < > 100 101  CO2 34*   < > 33* 37*  GLUCOSE 128*   < > 112* 106*  BUN 25*   < > 22* 16  CREATININE 1.77*   < > 1.00 0.82  CALCIUM 8.3*   < > 8.7* 8.9  MG 2.2   < > 2.4  --   AST 30  --   --   --   ALT 21  --   --   --   ALKPHOS 69  --   --   --   BILITOT 1.0  --   --   --    < > = values in this interval not displayed.    Cardiac Enzymes Recent Labs  Lab 03/18/18 0849  TROPONINI 0.18*    Microbiology Results  Results for orders placed or performed during the hospital encounter of 03/16/18  MRSA PCR Screening     Status: Abnormal   Collection Time: 03/16/18  3:21 AM  Result Value Ref Range Status   MRSA by PCR POSITIVE (A) NEGATIVE Final    Comment:        The GeneXpert MRSA Assay (FDA approved for NASAL specimens only), is one component of a comprehensive MRSA colonization surveillance program. It is not intended to diagnose MRSA infection nor to guide or monitor treatment for MRSA infections. RESULT CALLED TO, READ BACK BY AND VERIFIED WITH:  Marcha Solders AT 1610 03/16/18 SDR Performed at [facility], [address] [place], [place] [address]   Blood  Culture (routine x 2)     Status: None (Preliminary result)   Collection Time: 03/16/18  4:18 AM  Result Value Ref Range Status   Specimen Description  BLOOD Blood Culture adequate volume  Final   Special Requests BOTTLES DRAWN AEROBIC AND ANAEROBIC CENTRAL LINE  Final   Culture   Final    NO GROWTH 4 DAYS Performed at [facility] [address] [place], [place] [address]    Report Status PENDING  Incomplete  Blood Culture (routine x 2)     Status: None (Preliminary result)   Collection Time: 03/16/18  6:17 AM  Result Value Ref Range Status   Specimen Description BLOOD RIGHT HAND  Final   Special Requests   Final    BOTTLES DRAWN AEROBIC AND ANAEROBIC Blood Culture adequate volume   Culture   Final    NO GROWTH 4 DAYS Performed at [facility], [address], [place], [place] [address]    Report Status PENDING  Incomplete  Culture, respiratory (non-expectorated)     Status: None (Preliminary result)   Collection Time: 03/16/18  5:37 PM  Result Value Ref Range Status   Specimen Description   Final    TRACHEAL ASPIRATE Performed at [facility], [address] [place], [place] [address]    Special Requests   Final    NONE Performed at [facility], [address] [place], [place] [address]    Gram Stain   Final    ABUNDANT WBC PRESENT,BOTH PMN AND MONONUCLEAR NO ORGANISMS SEEN    Culture   Final    CULTURE REINCUBATED FOR BETTER GROWTH Performed at [facility], [address], [place], [place] [address]    Report Status PENDING  Incomplete    RADIOLOGY:  No results found.  EKG:   Orders placed or performed during the hospital encounter of 03/16/18  . EKG 12-Lead  . EKG 12-Lead  . ED EKG 12-Lead  . ED EKG 12-Lead      Management plans discussed with the patient, family and they are in agreement.  CODE STATUS:     Code Status Orders  (From admission, onward)         Start     Ordered   03/16/18 0333   Full code  Continuous     03/16/18 0332        Code Status History    Date Active Date Inactive Code Status Order ID Comments User Context   05/29/2017 2121 05/30/2017 1643 Full Code [id number]  Shaune Pollack, MD Inpatient      TOTAL TIME TAKING CARE OF THIS PATIENT: 35 minutes.    Altamese Dilling M.D on 03/20/2018 at 10:22 AM  Between 7am to 6pm - Pager - [phone]  After 6pm go to [email or web address] - password [email or web address]  [facility]  Office  [phone]  CC: Primary care physician; Charlott Rakes, MD   Note: This dictation was prepared with Dragon dictation along with smaller phrase technology. Any transcriptional errors that result from this process are unintentional.

## 2018-03-20 NOTE — Progress Notes (Signed)
Pharmacy Electrolyte Monitoring Consult:  Pharmacy consulted to assist in monitoring and replacing electrolytes in this 47 y.o. male admitted on 03/16/2018 with Respiratory Distress   Labs:  Sodium (mmol/L)  Date Value  03/19/2018 141   Potassium (mmol/L)  Date Value  03/19/2018 4.6   Magnesium (mg/dL)  Date Value  19/14/7829 2.4   Calcium (mg/dL)  Date Value  56/21/3086 8.9   Albumin (g/dL)  Date Value  57/84/6962 3.0 (L)    Assessment/Plan:  Will f/u labs in am Pharmacy will continue to follow and adjust per consult.   Bari Mantis PharmD Clinical Pharmacist 03/20/2018

## 2018-03-20 NOTE — Progress Notes (Signed)
Patient is medically stable for D/C to [facility] today. Per [facility] admissions coordinator at [facility] patient can come today. RN will call report and arrange EMS for transport. Clinical Child psychotherapist (CSW) sent D/C orders to [facility] via [facility]. Patient is aware of above. CSW left patient's grandmother Myrene Buddy a Engineer, technical sales. Please reconsult if future social work needs arise. CSW signing off.   [employer], LCSW [phone]

## 2018-03-20 NOTE — Progress Notes (Signed)
Pt discharged to [facility] via EMS. Report called to [place] at facility.

## 2018-03-20 NOTE — Progress Notes (Signed)
Pharmacy Antibiotic Note  Edgar Taylor is a 47 y.o. male admitted on 03/16/2018 with acute on chronic respiratory failure. Patient admitted via EMS from [facility]. Patient with past medical history significant for morbid obesity, asthma, diabetes, and hypertension. Pharmacy has been consulted for cefepime and vancomycin dosing. MRSA PCR positive.   Plan: Per CCM on ICU rounds 10/2 am, will discontinue vancomycin and monitor patient on cefepime monotherapy. Plan is for 8 days of cefepime  Continue cefepime 2 g IV Q8hr   Height: 6' (182.9 cm) Weight: (!) 517 lb (234.5 kg) IBW/kg (Calculated) : 77.6  ABW: 135kg  Temp (24hrs), Avg:98.4 F (36.9 C), Min:97.4 F (36.3 C), Max:99.3 F (37.4 C)  Recent Labs  Lab 03/16/18 0034 03/16/18 0343 03/16/18 0411 03/16/18 0421 03/17/18 0527 03/18/18 0521 03/18/18 0849 03/19/18 0557  WBC 18.5*  --   --  18.5* 17.6* 18.2*  --  14.7*  CREATININE 1.99* 1.77*  --   --  1.24 1.00  --  0.82  LATICACIDVEN 2.6*  --  1.4  --   --   --   --   --   VANCOTROUGH  --   --   --   --   --   --  19  --     Estimated Creatinine Clearance: 225.9 mL/min (by C-G formula based on SCr of 0.82 mg/dL).    No Known Allergies  Antimicrobials this admission: Vancomycin 9/30 >> 10/2 Cefepime 9/30 >> 10/6  Dose adjustments this admission: 9/30 Vancomycin and cefepime adjusted   Microbiology results: 9/30 BCx: no growth 2 days 9/30 Respiratory Cx: no organisms 9/30 MRSA PCR: positive   Thank you for allowing pharmacy to be a part of this patient's care.  Bari Mantis PharmD Clinical Pharmacist 03/20/2018

## 2018-03-21 LAB — CULTURE, BLOOD (ROUTINE X 2)
CULTURE: NO GROWTH
Culture: NO GROWTH
SPECIAL REQUESTS: ADEQUATE
SPECIMEN DESCRIPTION: ADEQUATE

## 2018-03-21 LAB — CULTURE, RESPIRATORY W GRAM STAIN

## 2018-03-21 LAB — CULTURE, RESPIRATORY: CULTURE: NORMAL

## 2018-06-17 DEATH — deceased

## 2019-01-24 IMAGING — CR DG CHEST 2V
2 series · 2 of 2 positions shown · non-contrast
Comparison: None.

CLINICAL DATA: Respiratory failure.

EXAM:
CHEST  2 VIEW

[chest ap]
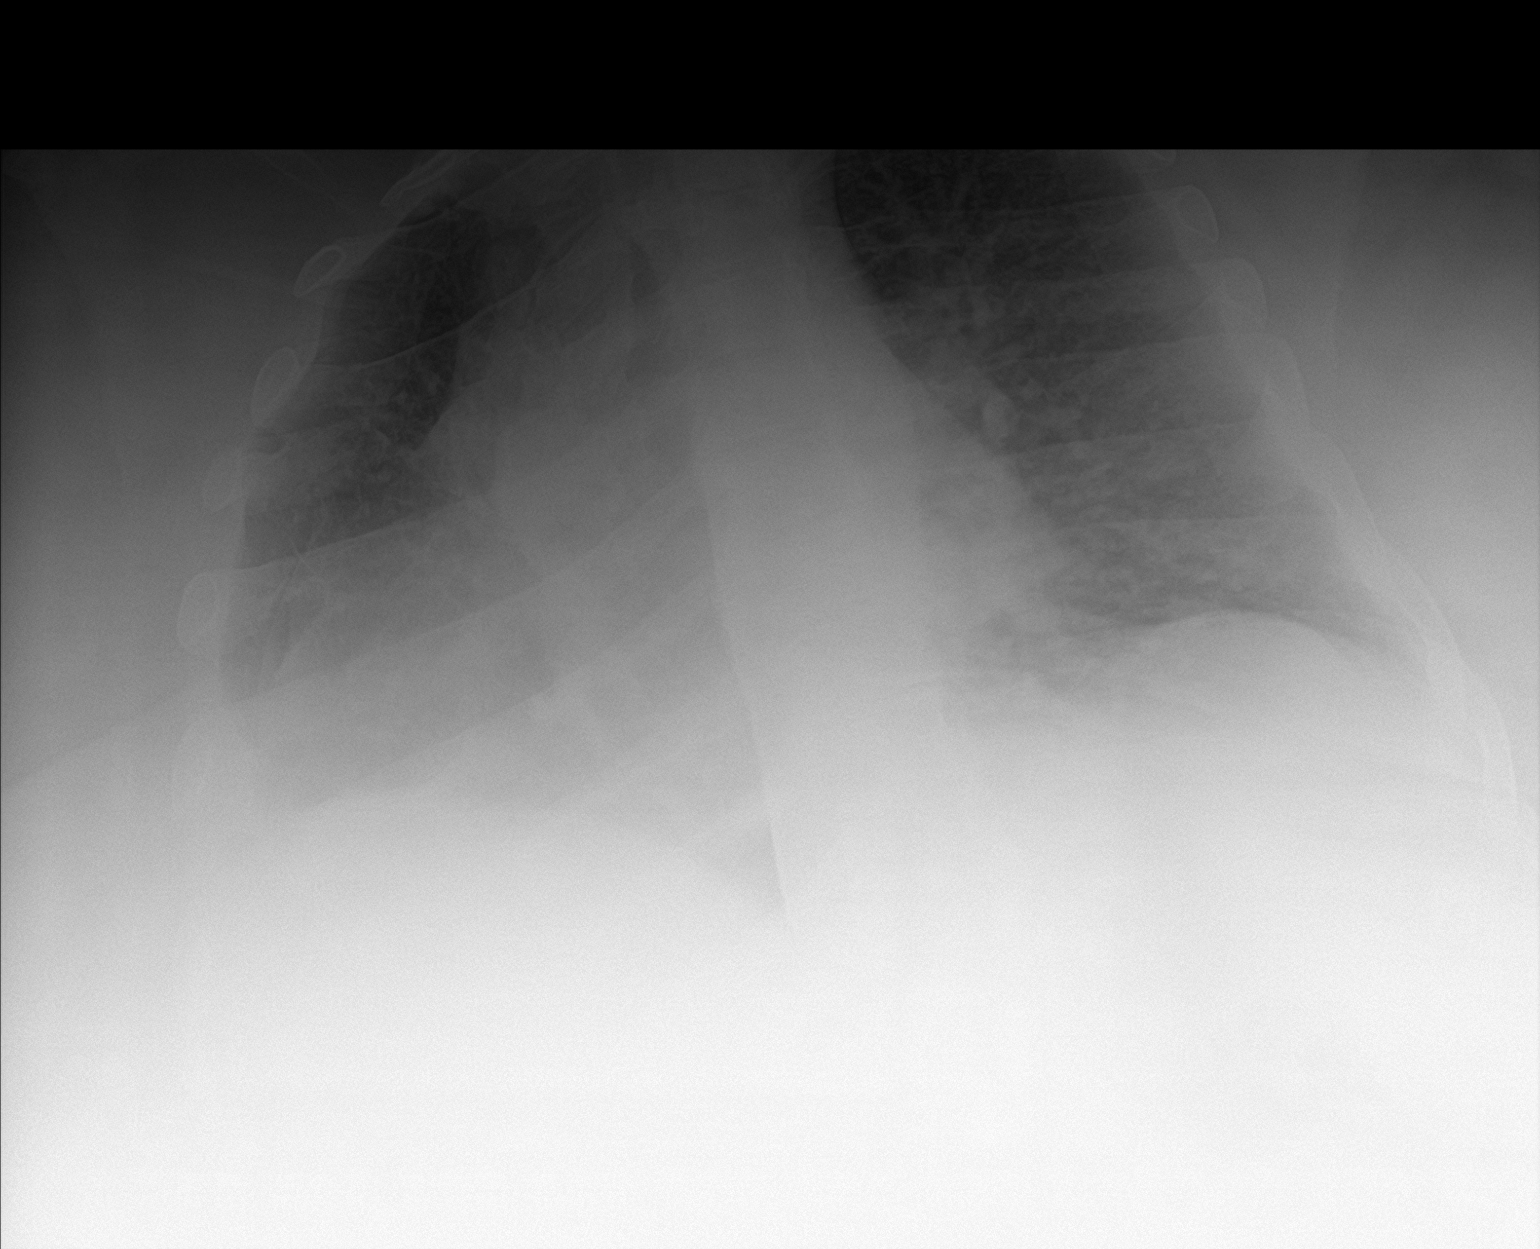

[chest lat]
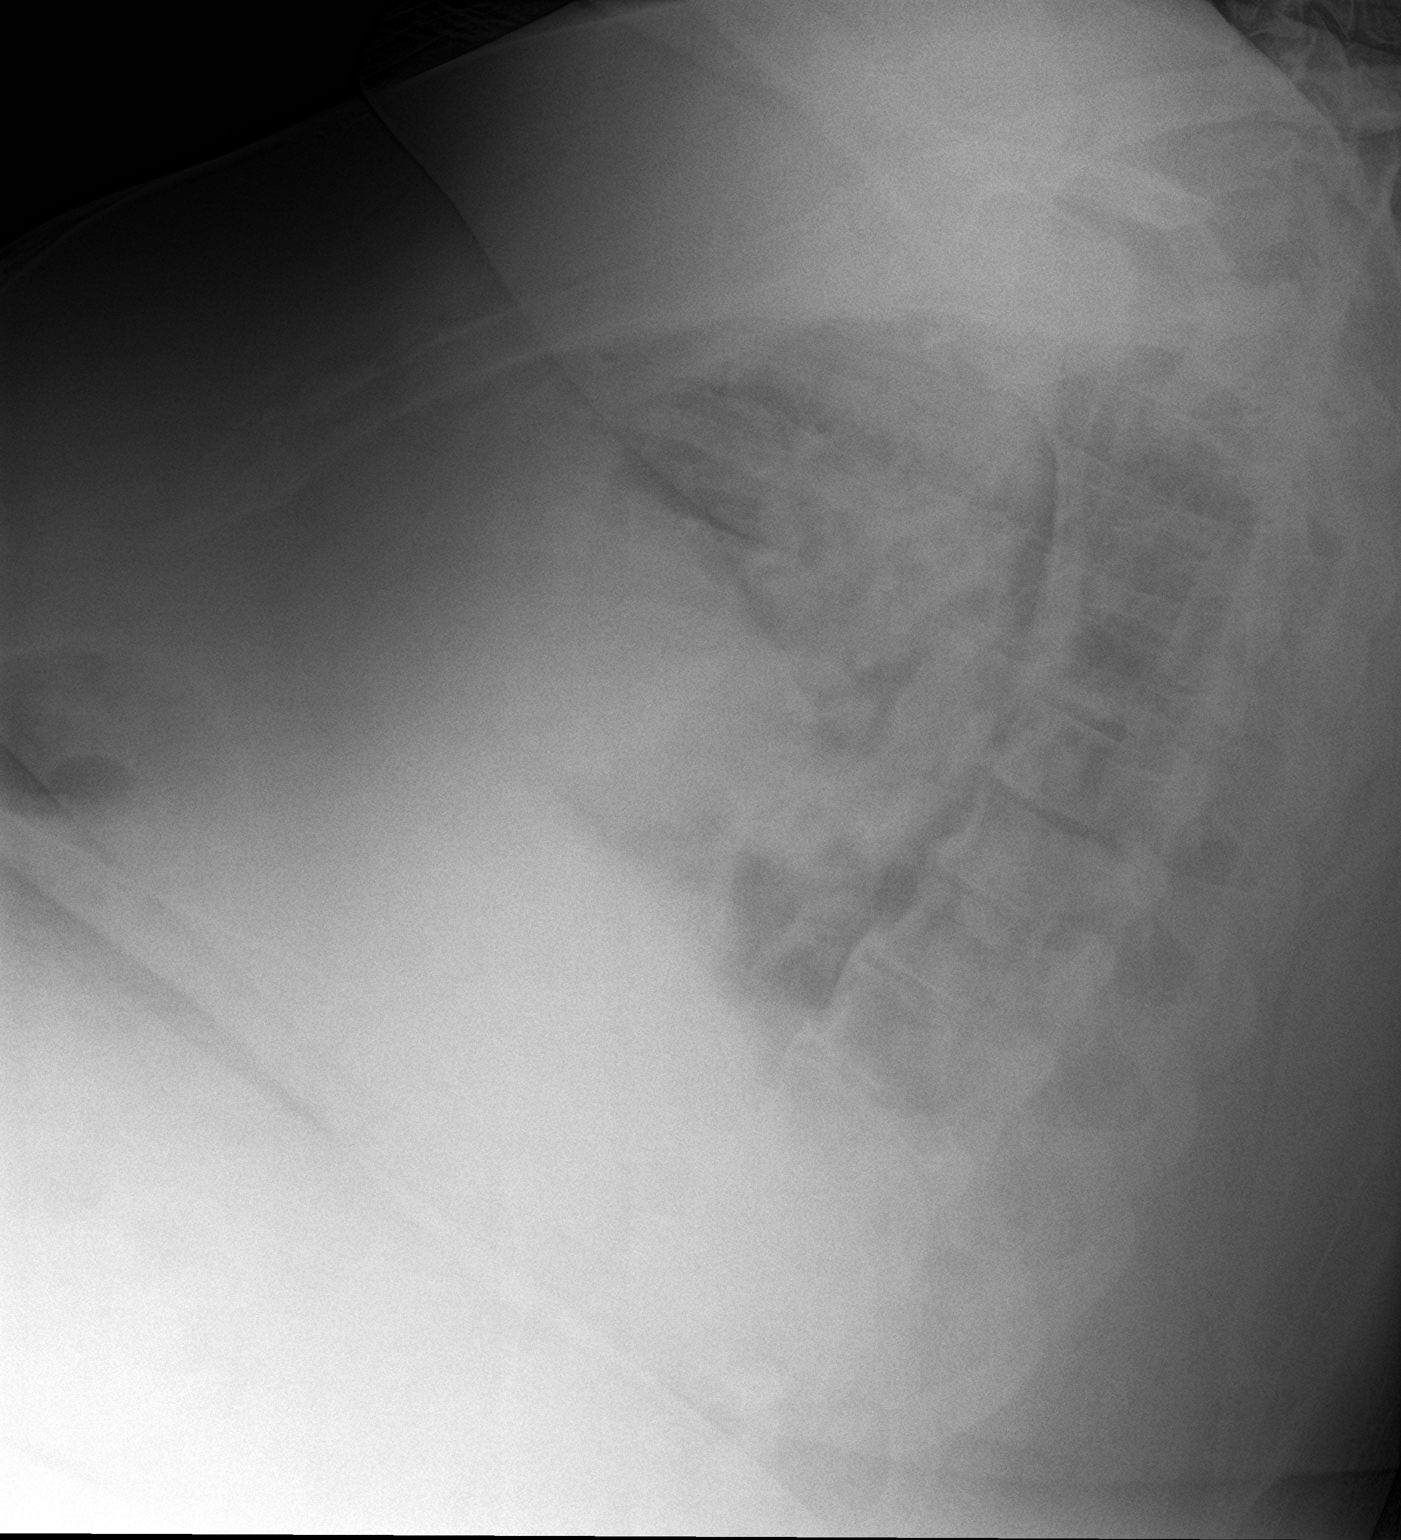

[2 of 2 positions shown; findings below may reference images not displayed]

FINDINGS: Markedly limited study secondary to patient rotation and to
technical limitations from patient's large body habitus. AP
projection shows marked rightward rotation displacing
cardiomediastinal anatomy over the medial right chest.
Cardiopericardial silhouette appears enlarged. There is probably
vascular congestion and collapse/ consolidation at the right base
cannot be excluded. No gross pleural effusion evident. No gross
displaced rib fracture evident.
IMPRESSION: 1. Markedly limited study due to positioning and body habitus.
2. Possible collapse/ consolidation at the right lung base.
3. Possible cardiomegaly/pericardial effusion.

## 2019-11-11 IMAGING — DX DG ABDOMEN 1V
1 series · 1 of 1 positions shown · non-contrast
Comparison: None.

CLINICAL DATA: NG tube placement.

EXAM:
ABDOMEN - 1 VIEW

[abdomen supine]
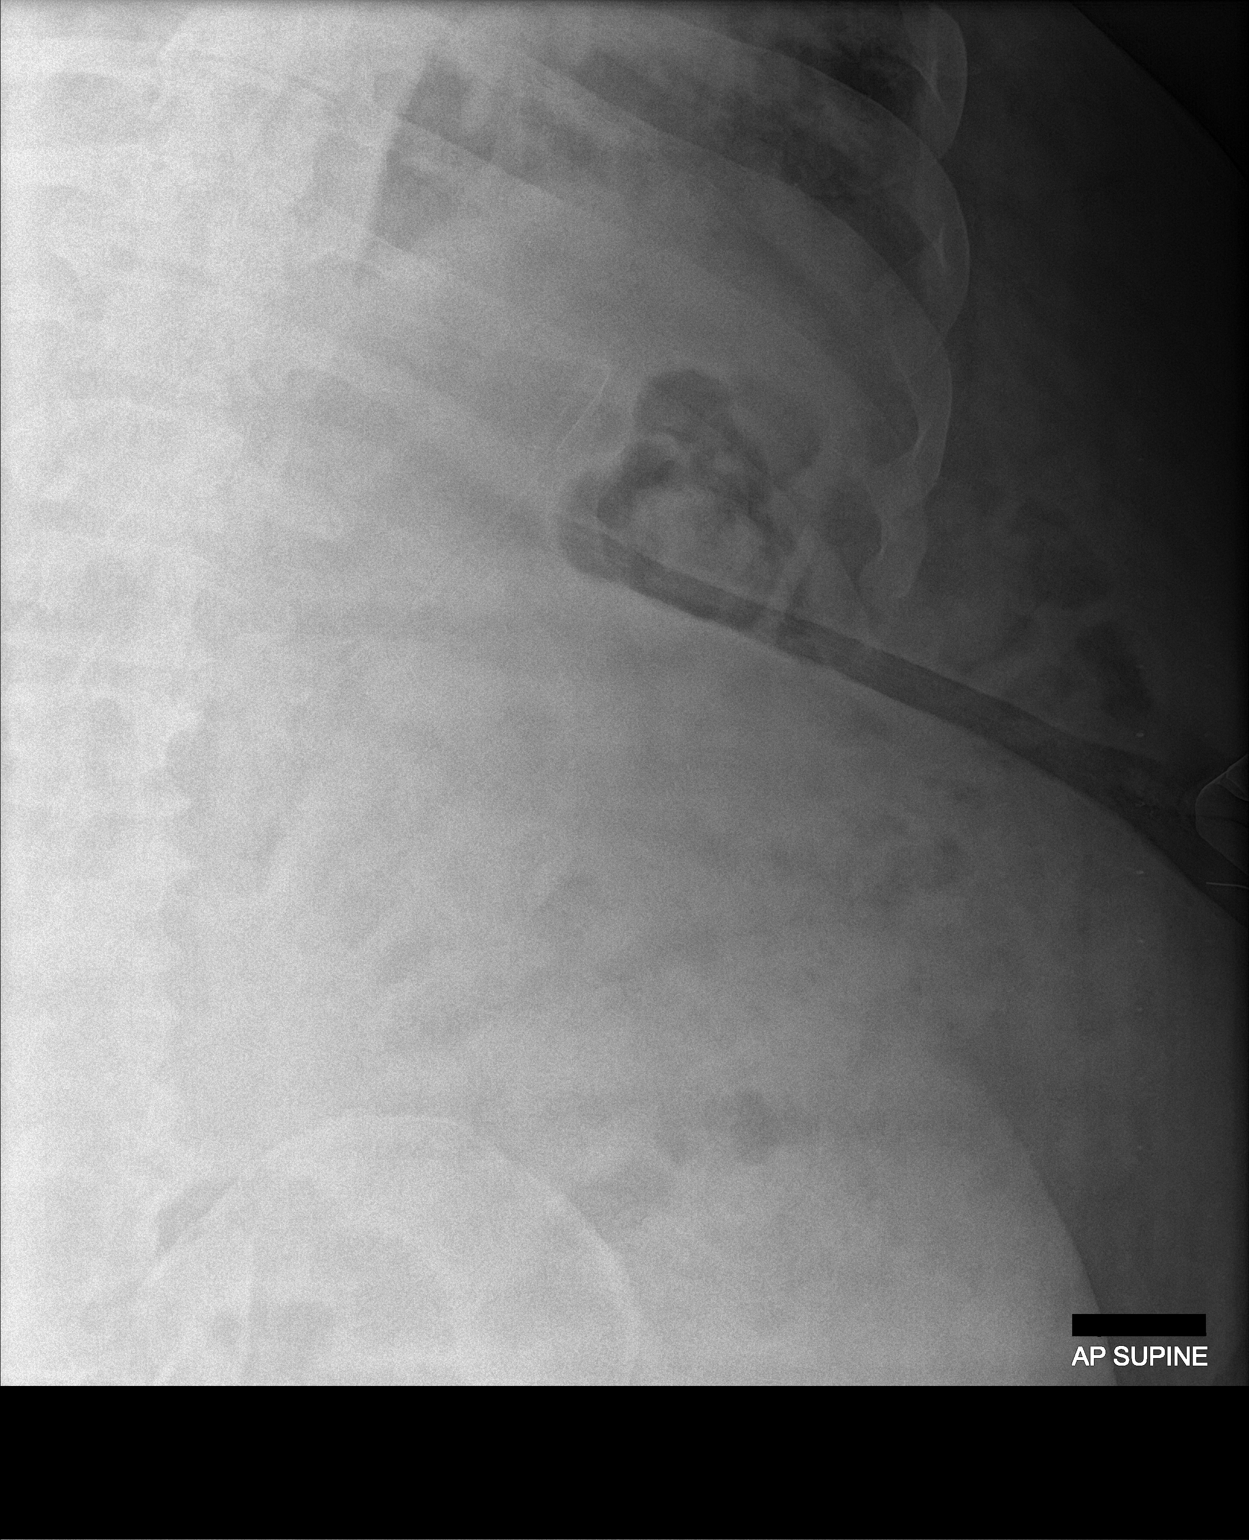

[1 of 1 positions shown; findings below may reference images not displayed]

FINDINGS: Tip and side port of the enteric tube below the diaphragm in the
stomach. No bowel dilatation to suggest obstruction. Body habitus
limits more detailed evaluation.
IMPRESSION: Tip and side port of the enteric tube below the diaphragm in the
stomach.

## 2019-11-11 IMAGING — US US RENAL
1 series · 14 of 25 positions shown · non-contrast
Comparison: None.

CLINICAL DATA: Acute kidney injury

EXAM:
RENAL / URINARY TRACT ULTRASOUND COMPLETE

[Series 1: us renal · 14 of 55 slices shown]
[im 1/55]
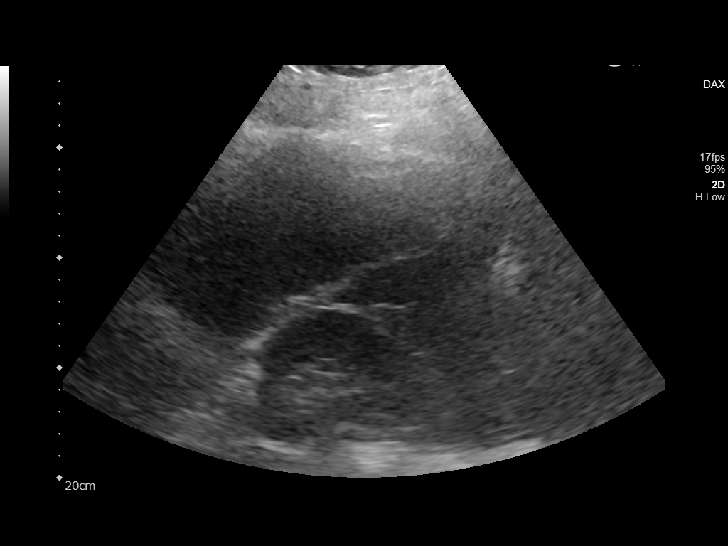
[im 5/55]
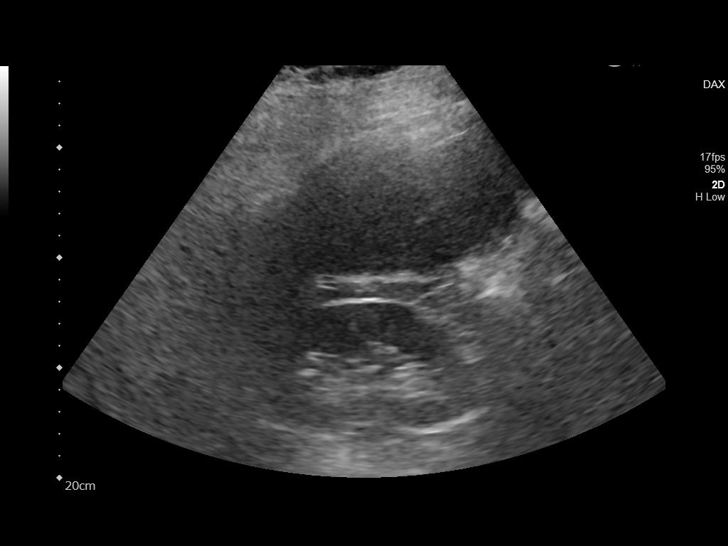
[im 10/55]
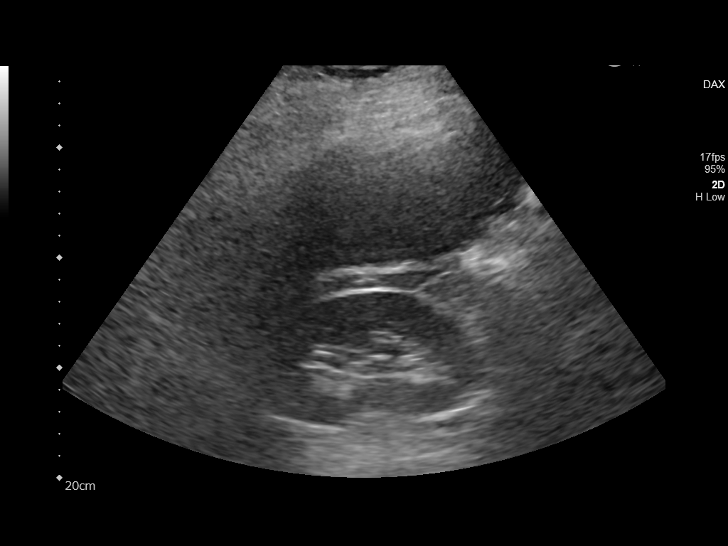
[im 14/55]
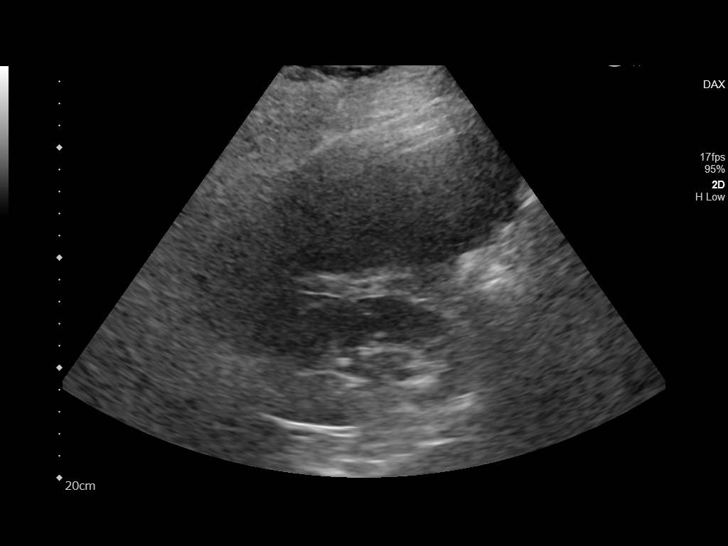
[im 19/55]
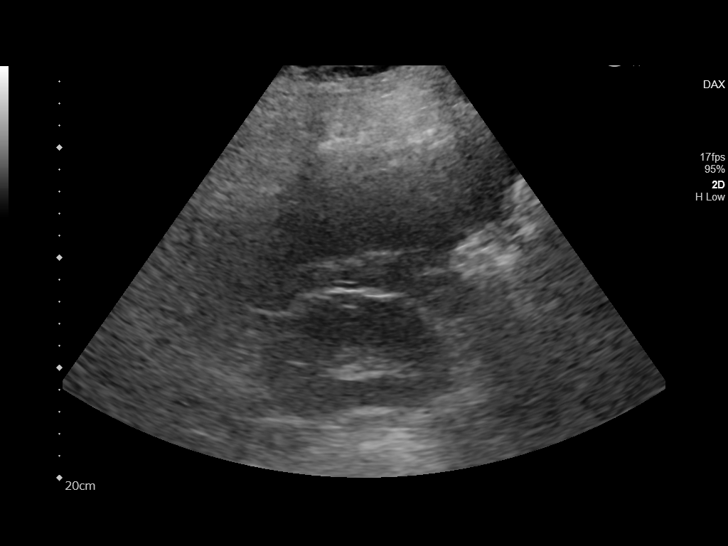
[im 21/55]
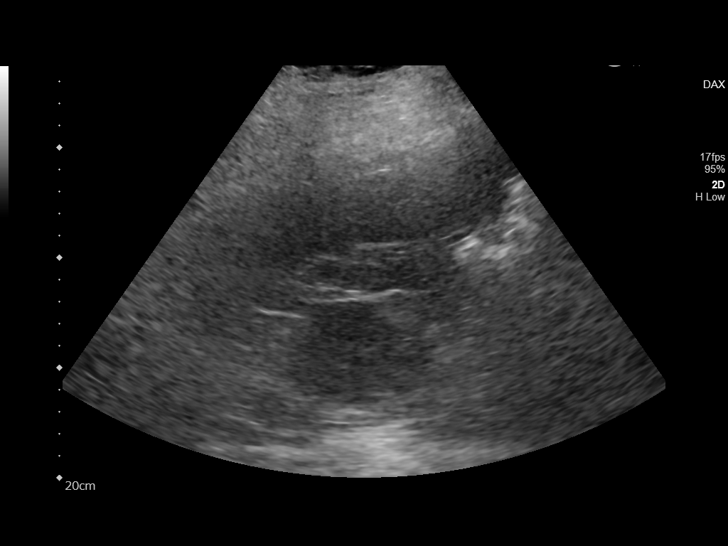
[im 25/55]
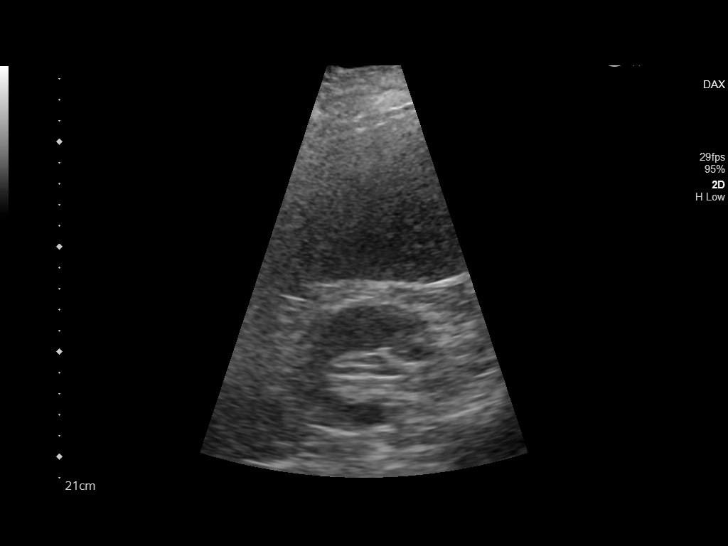
[im 30/55]
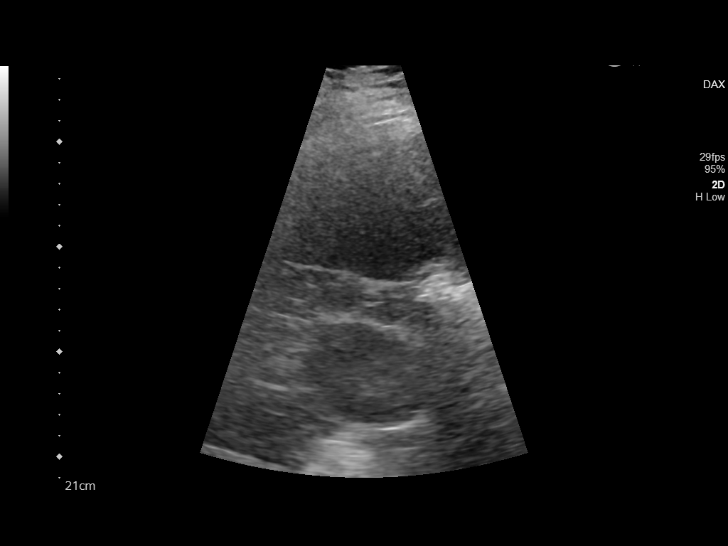
[im 34/55]
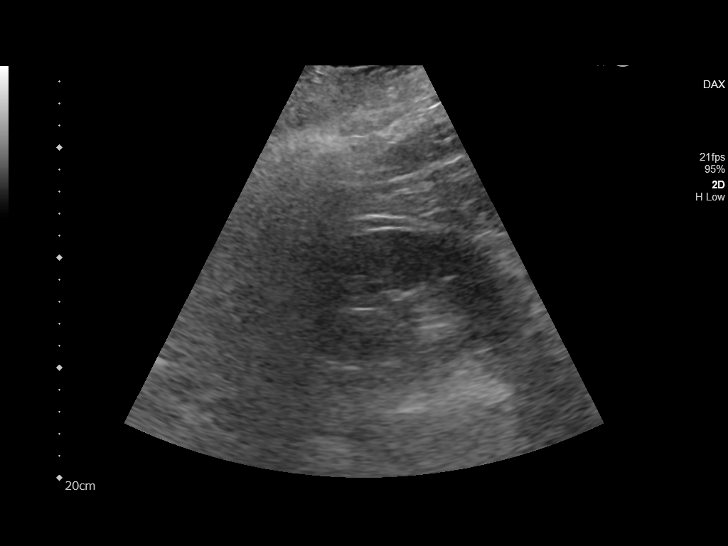
[im 37/55]
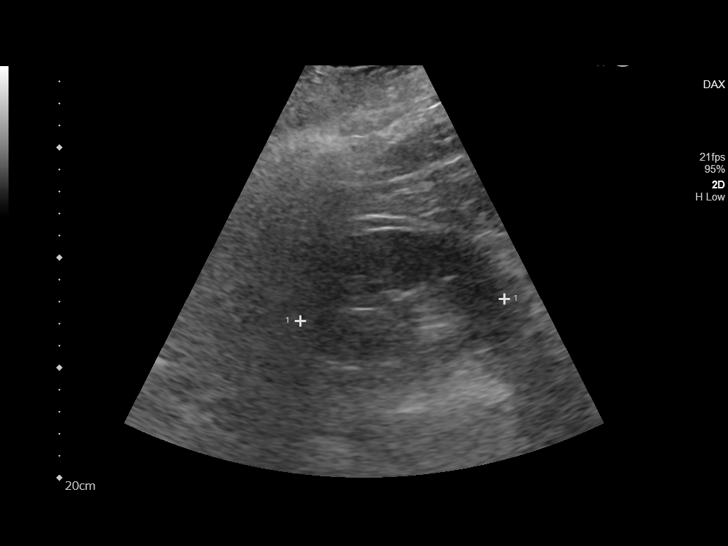
[im 41/55]
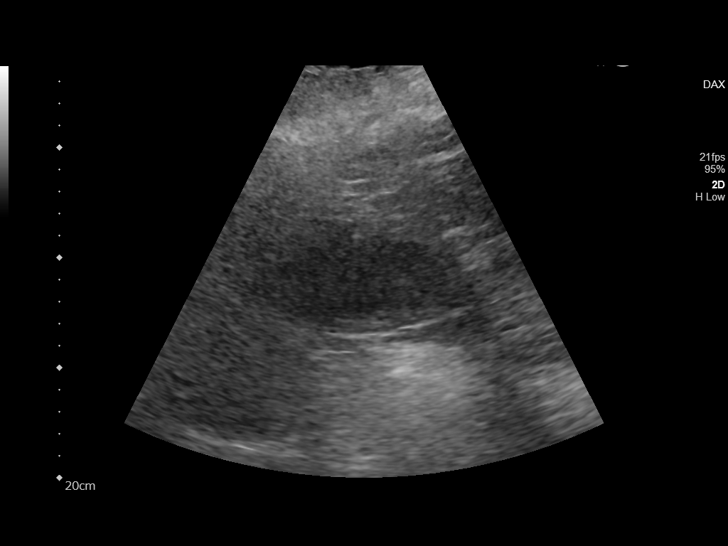
[im 46/55]
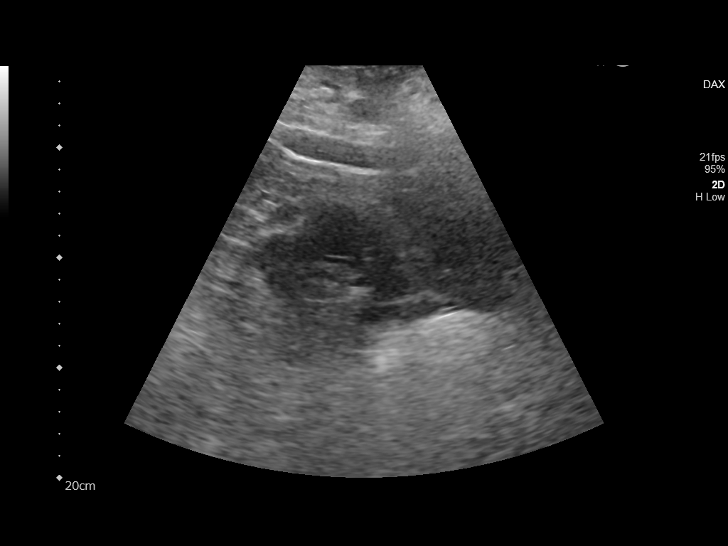
[im 50/55]
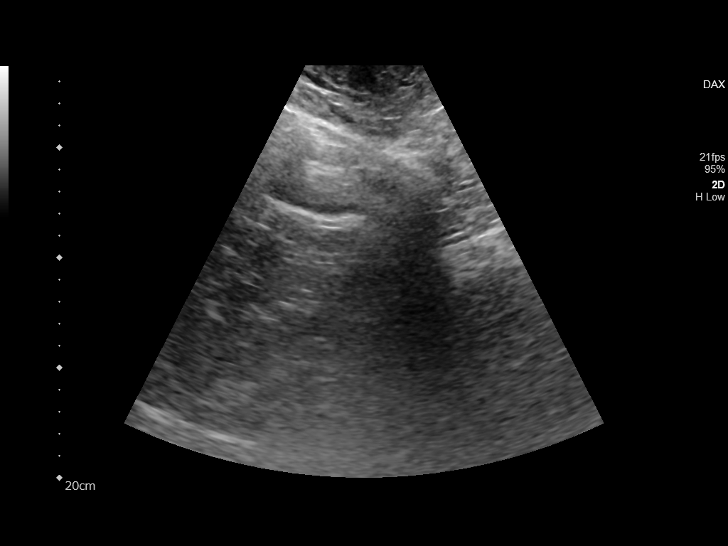
[im 55/55]
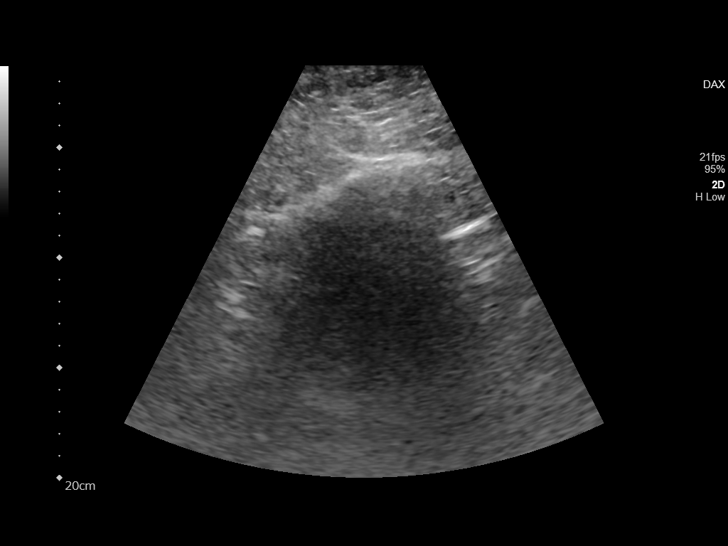

[14 of 25 positions shown; findings below may reference images not displayed]

FINDINGS: Right Kidney:

Length: 10.0 cm. Echogenicity is within normal limits. No mass or
hydronephrosis visualized.

Left Kidney:

Length: 9.3 cm. Echogenicity within normal limits. No mass or
hydronephrosis visualized.

Bladder:

Decompressed with Foley catheter in place, not well visualized.
IMPRESSION: No acute findings.

## 2019-11-11 IMAGING — DX DG CHEST 1V PORT
1 series · 1 of 1 positions shown · non-contrast
Comparison: 05/29/2017

CLINICAL DATA: ETT

EXAM:
PORTABLE CHEST 1 VIEW

[chest ap]
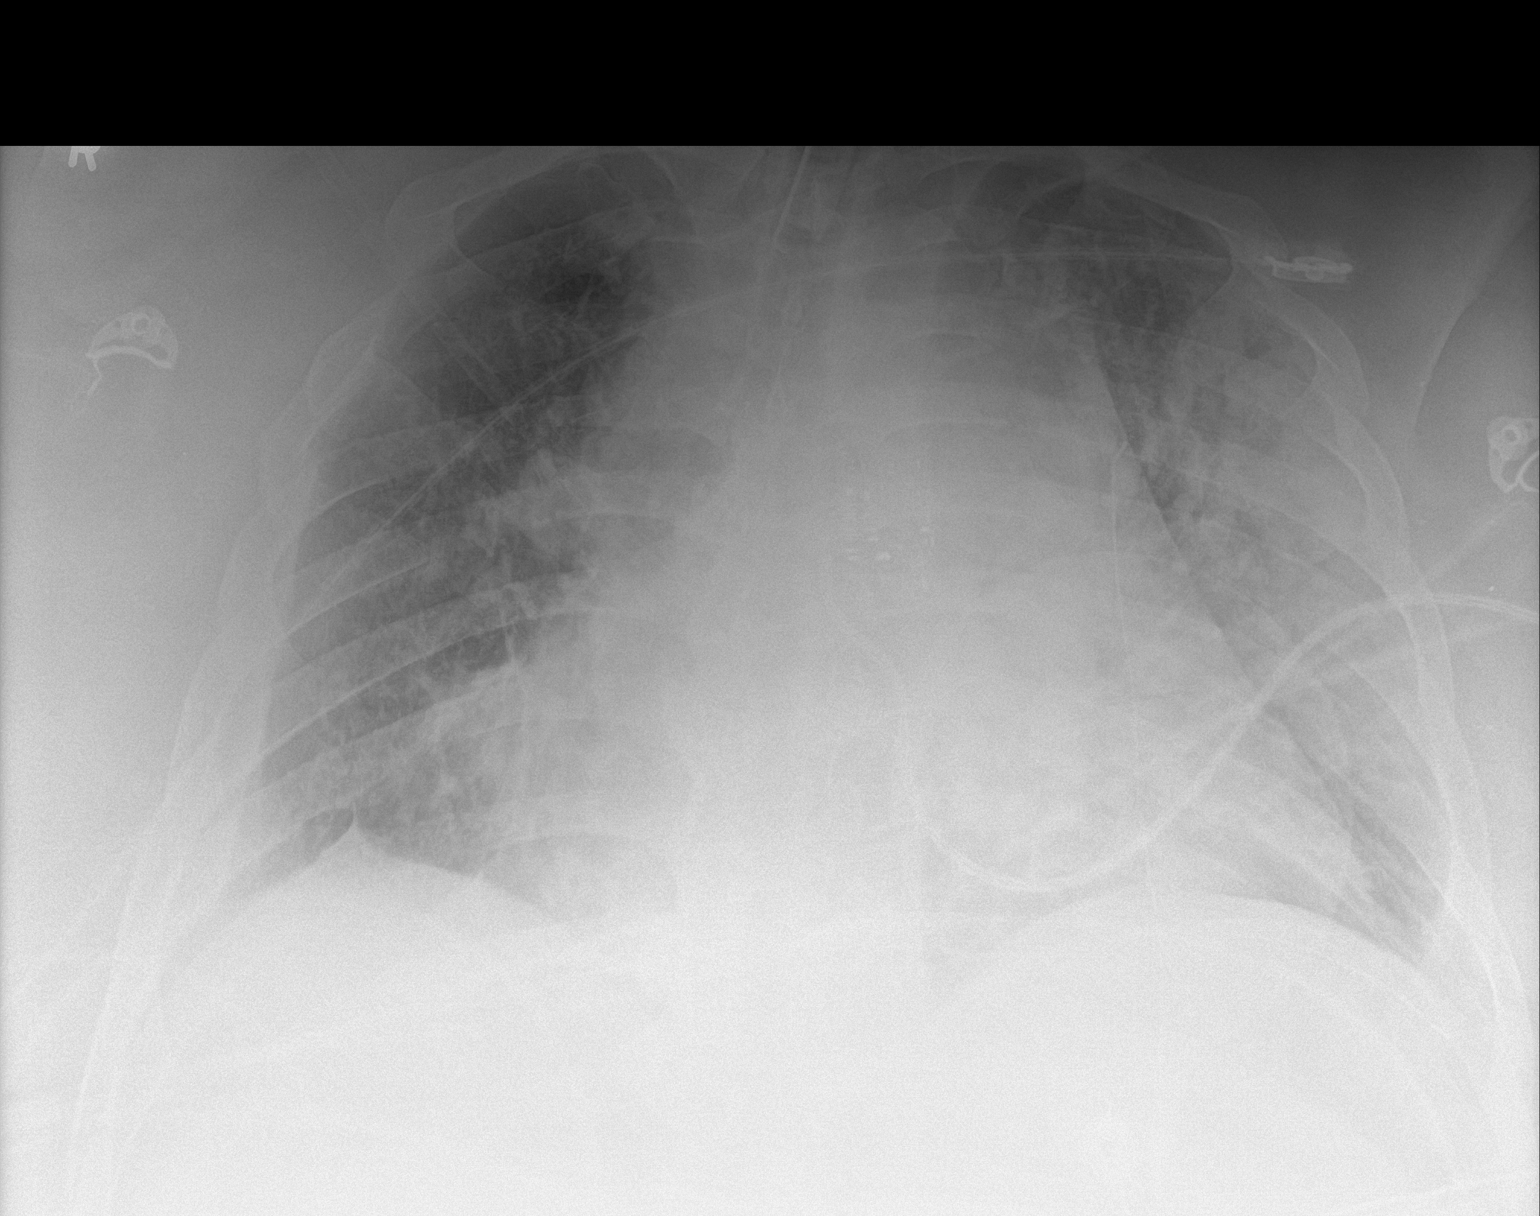

[1 of 1 positions shown; findings below may reference images not displayed]

FINDINGS: Endotracheal tube terminates 4 cm above the carina.

Left upper lobe opacity, suspicious for pneumonia, less likely
asymmetric interstitial edema. No definite pleural effusions. No
pneumothorax.

Cardiomegaly.
IMPRESSION: Endotracheal tube terminates 4 cm above the carina.

Left upper lobe opacity, suspicious for pneumonia.
